# Patient Record
Sex: Female | Born: 1965
Health system: Southern US, Community
[De-identification: ages and names within clinical notes are randomized; demographics above are authoritative.]

## PROBLEM LIST (undated history)

## (undated) DIAGNOSIS — M8580 Other specified disorders of bone density and structure, unspecified site: Secondary | ICD-10-CM

## (undated) DIAGNOSIS — Z808 Family history of malignant neoplasm of other organs or systems: Secondary | ICD-10-CM

## (undated) DIAGNOSIS — N809 Endometriosis, unspecified: Secondary | ICD-10-CM

## (undated) DIAGNOSIS — K8020 Calculus of gallbladder without cholecystitis without obstruction: Secondary | ICD-10-CM

## (undated) DIAGNOSIS — K589 Irritable bowel syndrome without diarrhea: Secondary | ICD-10-CM

## (undated) DIAGNOSIS — F329 Major depressive disorder, single episode, unspecified: Secondary | ICD-10-CM

## (undated) DIAGNOSIS — F32A Depression, unspecified: Secondary | ICD-10-CM

## (undated) DIAGNOSIS — E785 Hyperlipidemia, unspecified: Secondary | ICD-10-CM

## (undated) DIAGNOSIS — Z9189 Other specified personal risk factors, not elsewhere classified: Secondary | ICD-10-CM

## (undated) DIAGNOSIS — C50919 Malignant neoplasm of unspecified site of unspecified female breast: Secondary | ICD-10-CM

## (undated) DIAGNOSIS — D1803 Hemangioma of intra-abdominal structures: Secondary | ICD-10-CM

## (undated) DIAGNOSIS — Z8489 Family history of other specified conditions: Secondary | ICD-10-CM

## (undated) DIAGNOSIS — Z800 Family history of malignant neoplasm of digestive organs: Secondary | ICD-10-CM

## (undated) DIAGNOSIS — Z803 Family history of malignant neoplasm of breast: Secondary | ICD-10-CM

## (undated) DIAGNOSIS — R87619 Unspecified abnormal cytological findings in specimens from cervix uteri: Secondary | ICD-10-CM

## (undated) HISTORY — DX: Other specified personal risk factors, not elsewhere classified: Z91.89

## (undated) HISTORY — PX: APPENDECTOMY: SHX54

## (undated) HISTORY — PX: LAPAROSCOPY: SHX197

## (undated) HISTORY — PX: TUBAL LIGATION: SHX77

## (undated) HISTORY — DX: Major depressive disorder, single episode, unspecified: F32.9

## (undated) HISTORY — DX: Unspecified abnormal cytological findings in specimens from cervix uteri: R87.619

## (undated) HISTORY — DX: Hyperlipidemia, unspecified: E78.5

## (undated) HISTORY — DX: Irritable bowel syndrome, unspecified: K58.9

## (undated) HISTORY — DX: Family history of malignant neoplasm of digestive organs: Z80.0

## (undated) HISTORY — DX: Endometriosis, unspecified: N80.9

## (undated) HISTORY — PX: ECTOPIC PREGNANCY SURGERY: SHX613

## (undated) HISTORY — DX: Family history of malignant neoplasm of breast: Z80.3

## (undated) HISTORY — DX: Other specified disorders of bone density and structure, unspecified site: M85.80

## (undated) HISTORY — PX: SHOULDER SURGERY: SHX246

## (undated) HISTORY — DX: Family history of malignant neoplasm of other organs or systems: Z80.8

## (undated) HISTORY — DX: Hemangioma of intra-abdominal structures: D18.03

## (undated) HISTORY — PX: BACK SURGERY: SHX140

## (undated) HISTORY — DX: Depression, unspecified: F32.A

---
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
## 1988-03-01 HISTORY — PX: ECTOPIC PREGNANCY SURGERY: SHX613

## 1989-03-01 HISTORY — PX: CERVICAL CONE BIOPSY: SUR198

## 1997-12-18 ENCOUNTER — Ambulatory Visit (HOSPITAL_COMMUNITY): Admission: RE | Admit: 1997-12-18 | Discharge: 1997-12-18 | Payer: Self-pay | Admitting: Obstetrics and Gynecology

## 1998-11-28 ENCOUNTER — Other Ambulatory Visit: Admission: RE | Admit: 1998-11-28 | Discharge: 1998-11-28 | Payer: Self-pay | Admitting: Obstetrics and Gynecology

## 1999-02-22 ENCOUNTER — Emergency Department (HOSPITAL_COMMUNITY): Admission: EM | Admit: 1999-02-22 | Discharge: 1999-02-22 | Payer: Self-pay | Admitting: Emergency Medicine

## 1999-12-23 ENCOUNTER — Other Ambulatory Visit: Admission: RE | Admit: 1999-12-23 | Discharge: 1999-12-23 | Payer: Self-pay | Admitting: Obstetrics and Gynecology

## 2001-01-24 ENCOUNTER — Other Ambulatory Visit: Admission: RE | Admit: 2001-01-24 | Discharge: 2001-01-24 | Payer: Self-pay | Admitting: Obstetrics and Gynecology

## 2002-02-05 ENCOUNTER — Other Ambulatory Visit: Admission: RE | Admit: 2002-02-05 | Discharge: 2002-02-05 | Payer: Self-pay | Admitting: Obstetrics and Gynecology

## 2003-02-07 ENCOUNTER — Other Ambulatory Visit: Admission: RE | Admit: 2003-02-07 | Discharge: 2003-02-07 | Payer: Self-pay | Admitting: Obstetrics and Gynecology

## 2003-11-29 ENCOUNTER — Encounter: Admission: RE | Admit: 2003-11-29 | Discharge: 2003-11-29 | Payer: Self-pay | Admitting: Gastroenterology

## 2004-01-20 ENCOUNTER — Encounter: Admission: RE | Admit: 2004-01-20 | Discharge: 2004-01-20 | Payer: Self-pay | Admitting: Gastroenterology

## 2004-02-04 ENCOUNTER — Ambulatory Visit (HOSPITAL_COMMUNITY): Admission: RE | Admit: 2004-02-04 | Discharge: 2004-02-04 | Payer: Self-pay | Admitting: Gastroenterology

## 2004-03-09 ENCOUNTER — Ambulatory Visit (HOSPITAL_COMMUNITY): Admission: RE | Admit: 2004-03-09 | Discharge: 2004-03-09 | Payer: Self-pay | Admitting: Gastroenterology

## 2004-03-16 ENCOUNTER — Other Ambulatory Visit: Admission: RE | Admit: 2004-03-16 | Discharge: 2004-03-16 | Payer: Self-pay | Admitting: Obstetrics and Gynecology

## 2004-04-17 ENCOUNTER — Ambulatory Visit (HOSPITAL_COMMUNITY): Admission: RE | Admit: 2004-04-17 | Discharge: 2004-04-17 | Payer: Self-pay | Admitting: Obstetrics and Gynecology

## 2004-04-17 ENCOUNTER — Encounter (INDEPENDENT_AMBULATORY_CARE_PROVIDER_SITE_OTHER): Payer: Self-pay | Admitting: *Deleted

## 2004-07-01 ENCOUNTER — Encounter: Admission: RE | Admit: 2004-07-01 | Discharge: 2004-07-01 | Payer: Self-pay | Admitting: Gastroenterology

## 2005-04-16 ENCOUNTER — Other Ambulatory Visit: Admission: RE | Admit: 2005-04-16 | Discharge: 2005-04-16 | Payer: Self-pay | Admitting: Obstetrics and Gynecology

## 2005-06-30 ENCOUNTER — Encounter: Payer: Self-pay | Admitting: Obstetrics and Gynecology

## 2006-03-10 ENCOUNTER — Encounter: Admission: RE | Admit: 2006-03-10 | Discharge: 2006-03-10 | Payer: Self-pay | Admitting: Gastroenterology

## 2006-04-07 ENCOUNTER — Emergency Department (HOSPITAL_COMMUNITY): Admission: EM | Admit: 2006-04-07 | Discharge: 2006-04-07 | Payer: Self-pay | Admitting: Emergency Medicine

## 2006-06-24 ENCOUNTER — Ambulatory Visit (HOSPITAL_COMMUNITY): Admission: RE | Admit: 2006-06-24 | Discharge: 2006-06-24 | Payer: Self-pay | Admitting: Obstetrics and Gynecology

## 2006-06-24 ENCOUNTER — Encounter (INDEPENDENT_AMBULATORY_CARE_PROVIDER_SITE_OTHER): Payer: Self-pay | Admitting: Specialist

## 2006-07-31 HISTORY — PX: TUBAL LIGATION: SHX77

## 2009-01-06 ENCOUNTER — Encounter: Admission: RE | Admit: 2009-01-06 | Discharge: 2009-01-06 | Payer: Self-pay | Admitting: Neurosurgery

## 2009-01-17 ENCOUNTER — Ambulatory Visit (HOSPITAL_COMMUNITY): Admission: RE | Admit: 2009-01-17 | Discharge: 2009-01-17 | Payer: Self-pay | Admitting: Obstetrics and Gynecology

## 2010-06-03 LAB — CBC
HCT: 39.5 % (ref 36.0–46.0)
Hemoglobin: 13.4 g/dL (ref 12.0–15.0)
MCHC: 33.8 g/dL (ref 30.0–36.0)
MCV: 94.7 fL (ref 78.0–100.0)
Platelets: 263 10*3/uL (ref 150–400)
RBC: 4.17 MIL/uL (ref 3.87–5.11)
RDW: 12.4 % (ref 11.5–15.5)
WBC: 7.1 10*3/uL (ref 4.0–10.5)
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
## 2010-07-17 NOTE — Op Note (Signed)
NAMEMIRABELLA, Victoria Weiss NO.:  [id number]   MEDICAL RECORD NO.:  [id number]          PATIENT TYPE:  AMB   LOCATION:  ENDO                         FACILITY:  [facility]   PHYSICIAN:  James L. Malon Kindle., M.D.DATE OF BIRTH:  [birth year]   DATE OF PROCEDURE:  03/09/2004  DATE OF DISCHARGE:                                 OPERATIVE REPORT   PROCEDURE PERFORMED:  Colonoscopy.   ENDOSCOPIST:  Llana Aliment. Edwards, M.D.   MEDICATIONS:  Fentanyl 100 mcg, Versed 10 mg IV.   INSTRUMENT USED:  Pediatric Olympus adjustable colonoscope.   INDICATIONS FOR PROCEDURE:  Persistent left upper quadrant pain with a  strong family history of cancer on both sides of her family.  Grandparents  on both sides have had colon cancer.  Her mother has had bladder and colon  cancer and father has had colon polyps.  In view of her pain and strong  family history of colon as well as other malignancies, colonoscopy was  performed.   DESCRIPTION OF PROCEDURE:  The procedure had been explained to the patient  and consent obtained.  With the patient in the left lateral decubitus  position, the pediatric adjustable Olympus colonoscope was inserted and  advanced.  The prep was excellent.  The patient had a long tortuous colon  and several maneuvers were required to advance.  We finally placed her on  her back and used abdominal pressure.  We were able to advance to the cecum.  The ileocecal valve and appendiceal orifice were seen.  The scope was  withdrawn and the cecum, ascending colon, transverse colon, splenic flexure,  descending and sigmoid colon were seen well.  No polyps were seen  throughout.  There was no diverticular disease.  Other than the length of  the colon, there were no gross abnormalities and the mucosa appeared normal.  The scope was withdrawn down to the rectum.  The rectum was without polyps  and was otherwise normal.   ASSESSMENT:  1.  Left upper quadrant pain probably due to  irritable bowel syndrome, 7892.  2.  Strong family history of colonic neoplasia, V160.   PLAN:  Will recommend yearly Hemoccults, repeat colonoscopy in five years  and will keep her on her current medications and will see her back in the  office in six months.       JLE/MEDQ  D:  03/09/2004  T:  03/09/2004  Job:  [id number]   cc:   Oley Balm. Georgina Pillion, M.D.  [address]  [place]  [place] [address]  Fax: [phone]

## 2010-07-17 NOTE — H&P (Signed)
NAMEMarland Weiss  Victoria, Weiss NO.:  [id number]   MEDICAL RECORD NO.:  [id number]           PATIENT TYPE:   LOCATION:                                 FACILITY:   PHYSICIAN:  Guy Sandifer. Arleta Creek, M.D.   DATE OF BIRTH:   DATE OF ADMISSION:  04/17/2004  DATE OF DISCHARGE:                                HISTORY & PHYSICAL   CHIEF COMPLAINT:  Heavy menses.   HISTORY OF PRESENT ILLNESS:  This patient is a 45 year old married white  female, G3, P1, with a long history of endometriosis who noted increasingly  heavy menses while on the birth control pill.  Also, she had undergone an  extensive work-up for left upper quadrant pain including an abdominopelvic  CT scan.  There was an incidental finding of a 2.6 cm cyst on her ovary on  January 20, 2004.  Ultrasound in my office on March 23, 2004 revealed a  3.1 cm simple cyst of the left ovary.  A sonohystogram was consistent with a  1.1 cm mass in the endometrial cavity.  Follow up ultrasound on April 14, 2004 revealed an unchanged 3.1 cm simple cyst of the left ovary again  present.  After discussion of the options, the patient is being admitted for  hysteroscopy with resectoscope, dilation and curettage, laparoscopy,  possible left ovarian cystectomy, possible left salpingo-oophorectomy.  Potential risks and complications have been discussed with the patient  preoperatively.   PAST MEDICAL HISTORY:  1.  Endometriosis.  2.  History of hemorrhoids.  3.  History of depression.   PAST SURGICAL HISTORY:  1.  Appendectomy at age 60.  2.  D&C in 16.  3.  Right ectopic pregnancy with right salpingectomy and laparotomy in 1991.  4.  Laparoscopy in 1990, 1992, and 1998.  5.  Vein ligation in 1995 in the right leg.   OBSTETRIC HISTORY:  1.  Miscarriage x1.  2.  Ectopic pregnancy x1.  3.  Vaginal delivery x1.   FAMILY HISTORY:  Colon cancer in maternal grandfather.   SOCIAL HISTORY:  Denies tobacco, alcohol, or drug  abuse.   MEDICATIONS:  1.  Lo/Ovral daily.  2.  Lamictal daily.  3.  Lexapro daily.   ALLERGIES:  No known drug allergies.   REVIEW OF SYSTEMS:  NEUROLOGIC:  Denies headache.  CARDIAC:  Denies chest  pain.  PULMONARY:  Denies shortness of breath.  GASTROINTESTINAL:  Denies  recent changes in bowel habits.   PHYSICAL EXAMINATION:  VITAL SIGNS:  Height 5 feet, 7 inches.  Weight 134-  1/2 pounds.  Blood pressure 92/60.  HEENT:  Without thyromegaly.  LUNGS:  Clear to auscultation.  HEART:  Regular rate and rhythm.  BACK:  Without CVA tenderness.  BREASTS:  Without mass, retraction, or discharge.  ABDOMEN:  Soft, nontender, without masses.  PELVIC:  Vulva, vaginal, and cervix without lesion.  Uterus is normal size,  mobile, nontender.  Adnexa nontender without masses.  EXTREMITIES:  Neurological exam grossly within normal limits.   ASSESSMENT:  1.  Menorrhagia.  2.  Left ovarian cyst.  PLAN:  Hysteroscopy and resectoscope, dilation and curettage, laparoscopy,  possible left ovaria cystectomy, possible left salpingo-oophorectomy.      JET/MEDQ  D:  04/14/2004  T:  04/14/2004  Job:  [id number]

## 2010-07-17 NOTE — Op Note (Signed)
NAMEDAMYIA, STRIDER NO.:  [id number]   MEDICAL RECORD NO.:  [id number]          PATIENT TYPE:  AMB   LOCATION:  SDC                           [facility]ILITY[facility]   PHYSICIAN:  Guy Sandifer. Tomblin II, M.D.DATE OF BIRTH:  [birth year]   DATE OF PROCEDURE:  04/17/2004  DATE OF DISCHARGE:                                 OPERATIVE REPORT   PREOPERATIVE DIAGNOSES:  1.  Menorrhagia.  2.  Left ovarian cyst.   POSTOPERATIVE DIAGNOSES:  1.  Endometrial polyp.  2.  Left ovarian cyst.  3.  Endometriosis.   PROCEDURE:  Laparoscopy with left ovarian cystectomy and ablation of  endometriosis, hysteroscopy with resection of endometrial polyp and  dilatation curettage and 1% Xylocaine paracervical block.   SURGEON:  Guy Sandifer. Henderson Cloud, M.D.   ANESTHESIA:  General with endotracheal intubation.   ESTIMATED BLOOD LOSS:  Less than 50 mL.   I & O'S SORBITOL DISTENDING MEDIA:  40 mL deficit with some of that on the  floor.   SPECIMENS:  1.  Endometrial polyp.  2.  Endometrial curettings.  3.  Left ovarian cyst wall.   INDICATIONS AND CONSENT:  This patient is a 45 year old, married white  female, G3, P1 with a long history of endometriosis with increasingly heavy  vaginal bleeding and a left ovarian cyst. The details are dictated in the  history and physical. Laparoscopy, left ovarian cystectomy, possible left  salpingo-oophorectomy, hysteroscopy D&C has been discussed preoperatively.  The potential risks and complications had been reviewed preoperatively  including but not limited to infection, uterine perforation, bowel, bladder,  ureteral damage, bleeding requiring transfusion of blood products with  possible transfusion reaction, HIV and hepatitis acquisition, DVT, PE,  pneumonia, recurrent pain or abnormal bleeding, hysterectomy and  dyspareunia. All questions have been answered and consent is signed on the  chart.   FINDINGS:  There is approximately a 1.5 cm pedunculated  polypoid structure  on the anterior endometrial canal. The fallopian tube ostia are identified  bilaterally. Abdominally there are powder burn type implants of  endometriosis on the vesicouterine peritoneum on the right side of the  midline in the right side of the posterior cul-de-sac medial to the  uterosacral ligaments and on the left pelvic sidewall well superior to the  course of the ureter. The right ovary appears normal. The right fallopian  tube is surgically absent. The left fallopian tube is normal in appearance  with luxuriant fimbria. The left ovary contains approximately a 3 cm cyst  but is otherwise normal in appearance.  The uterus is about six weeks in  size but smooth in contour.   DESCRIPTION OF PROCEDURE:  In the preop holding area, the patient's left  lower quadrant has been marked with a pen. The patient was then taken to the  operating room where she was identified, placed in the dorsal supine  position and general anesthesia is then induced via endotracheal intubation.  She was then placed in the dorsal lithotomy position where she was prepped  abdominally and vaginally, bladder straight catheterized and she is draped  in a sterile fashion. A bivalve speculum is placed in the vagina, the  anterior cervical lip is injected with 1% Xylocaine and grasped with a  single tooth tenaculum.  Paracervical block was then placed at the 2, 4, 5,  7, 8 and 10 o'clock positions with approximately 20 mL of 1% Xylocaine. The  cervix was then gently progressively dilated to a 31 Pratt dilator. The  resectoscope was then placed in the endocervical canal and advanced under  direct visualization using sorbitol distending media. The above findings are  noted. Then using the single right angle wire loop, the polyp is resected  without difficulty. The specimen is removed. Sharp curettage is then carried  out. The single tooth tenaculum is replaced with a hulka tenaculum and  attention is  turned to the abdomen. A small infraumbilical incision is made  and a disposable Veress is placed on the first attempt without difficulty.  Placement is verified with a normal syringe and drop test. Two liters of gas  are then insufflated under low pressure with good tympany in the right upper  quadrant.  The Veress needle is then removed and is replaced with a 10/11  disposable trocar sleeve. Placement is verified with the laparoscope and no  damage to surrounding structures is noted. A small suprapubic and later left  lower quadrant incisions are made after careful transillumination and 5 mm  disposable trocar sleeves are placed under direct visualization without  difficulty. The above findings are noted. The areas of endometriosis are  ablated with bipolar cautery.  The left ovary is then grasped and an  incision is made in the capsule over the left ovary using the Gyrus bipolar  cautery needle.  This enters the cyst which produces straw colored serous  fluid.  The ovarian capsule is then opened and the cyst wall was stripped  out with scissors and sent to pathology. Bipolar cautery is used to obtain  complete hemostasis in the ovary.  Copious irrigation is carried out and  excess fluid is removed. After assuring good hemostasis, Intercede is  backloaded through the laparoscope and placed around the left ovary.  The  suprapubic and left lower quadrant trocar sleeves are removed,  pneumoperitoneum is reduced, the umbilical trocar sleeve is removed. A  single 2-0 Vicryl suture is then placed in the subcutaneous tissues at the  umbilical incision with care being taken not to pick up any underlying  structures. Marcaine 0.5% is injected in all the incisions and the skin is  closed in all the incisions with Dermabond. Hulka tenaculum is removed and  good hemostasis is noted. All counts are correct. The patient is taken to the recovery room in stable condition.      JET/MEDQ  D:   04/17/2004  T:  04/18/2004  Job:  [id number]

## 2010-07-17 NOTE — Op Note (Signed)
NAME:  Victoria Weiss, Victoria Weiss NO.:  [id number]   MEDICAL RECORD NO.:  [id number]          PATIENT TYPE:  AMB   LOCATION:  SDC                           FACILITY[facility]   PHYSICIAN:  Guy Sandifer. Henderson Cloud, M.D. DATE OF BIRTH:  1965-04-15   DATE OF PROCEDURE:  06/23/2006  DATE OF DISCHARGE:                               OPERATIVE REPORT   PREOPERATIVE DIAGNOSES:  1. Menorrhagia  2. Dysmenorrhea.  3. Desires permanent sterilization.   POSTOPERATIVE DIAGNOSES:  1. Menorrhagia.  2. Endometriosis.  3. Desires permanent sterilization.   PROCEDURES:  1. NovaSure endometrial ablation.  2. Hysteroscopy.  3. Dilatation and curettage.  4. Laparoscopy with left tubal ligation with Filshie clips.  5. Ablation of endometriosis.   SURGEON:  Guy Sandifer. Henderson Cloud, M.D.   ANESTHESIA:  General with endotracheal intubation.   SPECIMENS:  Endometrial curettings.   ESTIMATED BLOOD LOSS:  Drops.   I&O OF SORBITOL DISTENDING MEDIA:  60 mL deficit.   INDICATIONS AND CONSENT:  This patient is a 45 year old married white  female, G3, P1, with known endometriosis, who Is also status post right  salpingectomy for ectopic pregnancy.  Has heavy painful menses and  desires permanent sterilization.  Alternate methods of contraception  have been reviewed.  Laparoscopy, tubal ligation with Filshie clips,  Hysteroscopy, dilatation and curettage, and NovaSure endometrial  ablation have been discussed preoperatively.  Potential risks and  complications have been discussed preoperatively, including but not  limited to infection, bowel, bladder or ureteral damage; bleeding  requiring transfusion of blood products with possible transfusion  reaction, HIV and hepatitis acquisition; uterine perforation, DVT, PE,  pneumonia, hysterectomy.  Permanence of tubal ligation, failure rate and  increased ectopic risk have been reviewed.  NovaSure endometrial  ablation success and failure rates have been reviewed.   All questions  have been answered and consent is signed on the chart.   FINDINGS:  Endometrial cavity is without abnormal structure.  Upper  abdomen is grossly normal.  Uterus is upper normal size.  Anterior cul-  de-sac is normal.  There is a black powder burn-type endometrial implant  in the center of the posterior cul-de-sac.  Along the left uterosacral  ligament there are white puckered implants of endometriosis.  Ovaries  are normal bilaterally.  The left fallopian tube is normal.  The right  fallopian tube has been removed.   PROCEDURE:  The patient is taken to operating room where she is  identified, placed in dorsal supine position, and general anesthesia is  induced via endotracheal intubation.  She is then placed in the dorsal  lithotomy position, where she is prepped abdominally and vaginally, the  bladder is straight-catheterized, and she is draped in a sterile  fashion.  A bivalve speculum is placed in the vagina and the anterior  cervical lip is injected with 1% Xylocaine and the cervix is grasped  with single-tooth tenaculum.  A paracervical block is placed in the 2,  4, 5, 7, 8 and 10 10 o'clock positions with approximately 20 mL total of  1% plain Xylocaine.  The uterus sounds  to 7 cm.  The cervix measures to  3 cm.  The cervix is gently progressively dilated.  The diagnostic  hysteroscope is placed in the endocervical canal and advanced under  direct visualization using sorbitol distending media.  The above  findings are noted.  The hysteroscope is withdrawn.  Sharp curettage is  carried out.  The NovaSure device is then placed in the endometrial  cavity.  After adjusting for the proper width a cavity test was carried  out, which is successful.  Ablation is then carried out and the machine  completes its cycle at 1 minute 18 seconds.  The NovaSure device is then  removed and inspection reveals it to be intact.  The hysteroscope is  again placed in the endocervical  canal and advanced under direct  visualization.  Good cautery is noted throughout the cavity and in the  cornua.  No evidence of perforation is noted.  The hysteroscope is  removed and a Hulka tenaculum is placed in the single-tooth tenaculum is  removed.  Attention was turned to the abdomen.  The infraumbilical and  suprapubic areas are injected the midline with 0.5% plain Marcaine.  A  small infraumbilical incision is made.  A disposable Veress needle is  placed and a normal syringe and drop test are noted.  Two liters of gas  are insufflated under low pressure with good tympany in the right upper  quadrant.  The Veress needle is removed.  A 10/11 XL bladeless  disposable trocar is then placed under direct visualization using the  diagnostic laparoscopic.  After placement this is switched to the  operative laparoscopic.  A small suprapubic incision is made in the  midline and a 5-mm XL bladeless disposable trocar sleeve is placed under  direct visualization without difficulty.  The above findings are noted.  A small amount of sorbitol in the posterior cul-de-sac is aspirated.  The lesions of endometriosis are then ablated with bipolar cautery.  This is well clear of the course of the ureter.  The after carefully  identifying the left fallopian tube from cornu to fimbriae, a Filshie  clip is placed on the proximal one-third of the tube.  The applicator is  then removed and inspection reveals the entire width of the tube to be  within the clip and the heel of the clip is easily noted through the  mesosalpinx.  Marcaine 0.5% plain 9 mL is then dripped on the left  fallopian tube.  The suprapubic trocar sleeve is removed and  pneumoperitoneum is reduced and the umbilical trocar sleeve is removed.  The skin of the umbilical incision is closed with a subcuticular 3-0  Vicryl suture.  Dermabond is placed on both incisions.  Hulka tenaculum is removed and no bleeding was noted.  All counts are  correct.  The  patient is awakened, taken to the recovery room in stable condition.      Guy Sandifer Henderson Cloud, M.D.  Electronically Signed     JET/MEDQ  D:  06/24/2006  T:  06/24/2006  Job:  [id number]

## 2010-07-17 NOTE — H&P (Signed)
Victoria Weiss, Victoria Weiss NO.:  [id number]   MEDICAL RECORD NO.:  [id number]          PATIENT TYPE:  AMB   LOCATION:  SDC                           FACILITY:  [facility]   PHYSICIAN:  Guy Sandifer. Henderson Cloud, M.D. DATE OF BIRTH:  05-07-1965   DATE OF ADMISSION:  DATE OF DISCHARGE:                              HISTORY & PHYSICAL   PREADMISSION HISTORY AND PHYSICAL   CHIEF COMPLAINT:  Heavy, painful menses; desires permanent  sterilization.   HISTORY OF PRESENT ILLNESS:  This patient is a 45 year old, married  white female, G3, P1, with known endometriosis status post right  salpingectomy for ectopic pregnancy status post hysteroscopy with  resection of submucous fibroid in 2006, continues to have heavy, painful  menses despite trials with a birth control pill.  Ultrasound on December 20, 2005, reveals a uterus measuring 8.8 x 4.5 x 5.2 cm.  Sonohystogram  is negative for intracavity masses, and the ovaries appeared normal.  After discussion of the options, she has being admitted for hysteroscopy  D&C, NovaSure endometrial ablation, and laparoscopy with bilateral tubal  ligation.  Potential risks and complications have been discussed  preoperatively.   PAST MEDICAL HISTORY:  1. Endometriosis.  2. History of hemorrhoids.  3. Depression.   PAST SURGICAL HISTORY:  1. Appendectomy at age 56.  2. D&C, 48.  3. Right ectopic pregnancy with right salpingectomy and laparotomy in      1991.  4. Laparoscopy in 1990, 1992, and 1998.  5. Vein ligation in right leg, 1995.  6. Hysteroscopy D&C in 2006.   OBSTETRIC HISTORY:  Miscarriage x1, ectopic pregnancy x1, vaginal  delivery x1.   FAMILY HISTORY:  Colon cancer, maternal grandfather.   SOCIAL HISTORY:  Denies tobacco, alcohol, or drug abuse.   MEDICATIONS:  Lamictal, Wellbutrin, and Lexapro.  No drug allergies.   REVIEW OF SYSTEMS:  NEURO:  Denies headache.  CARDIAC:  Denies chest  pain.  PULMONARY:  Denies shortness of  breath.  GI:  Denies recent  changes in bowel habits.   PHYSICAL EXAMINATION:  VITAL SIGNS:  Height 5 feet 7 inches, weight  144.5 pounds, blood pressure 102/60.  HEENT:  Without thyromegaly.  LUNGS:  Clear to auscultation.  HEART:  Regular rate and rhythm.  BACK:  Without CVA tenderness.  BREASTS:  Without mass, retraction, or discharge.  ABDOMEN:  Soft and nontender without masses.  PELVIC EXAM:  Vulva, vagina, and cervix without lesion, uterus normal  size, mobile, and nontender, adnexa nontender without masses.  EXTREMITIES:  Grossly within normal limits.  NEUROLOGICAL EXAM:  Grossly within normal limits.   ASSESSMENT:  1. Menorrhagia.  2. Dysmenorrhea.  3. Desires permanent sterilization.   PLAN:  Laparoscopy with bilateral tubal ligation; hysteroscopy  dilatation and curettage; NovaSure endometrial ablation.      Guy Sandifer Henderson Cloud, M.D.  Electronically Signed     JET/MEDQ  D:  04/04/2006  T:  04/04/2006  Job:  [id number]

## 2011-01-07 ENCOUNTER — Other Ambulatory Visit: Payer: Self-pay | Admitting: Gastroenterology

## 2011-01-07 DIAGNOSIS — D1800 Hemangioma unspecified site: Secondary | ICD-10-CM

## 2011-01-11 ENCOUNTER — Other Ambulatory Visit: Payer: Self-pay

## 2011-03-31 ENCOUNTER — Ambulatory Visit
Admission: RE | Admit: 2011-03-31 | Discharge: 2011-03-31 | Disposition: A | Discharge status: Home / Self Care | Payer: BC Managed Care – PPO | Source: Ambulatory Visit | Attending: Gastroenterology | Admitting: Gastroenterology

## 2011-03-31 DIAGNOSIS — D1800 Hemangioma unspecified site: Secondary | ICD-10-CM

## 2011-09-01 ENCOUNTER — Other Ambulatory Visit: Payer: Self-pay | Admitting: Neurosurgery

## 2011-09-01 ENCOUNTER — Other Ambulatory Visit: Payer: Self-pay | Admitting: Gastroenterology

## 2011-09-01 DIAGNOSIS — M47817 Spondylosis without myelopathy or radiculopathy, lumbosacral region: Secondary | ICD-10-CM

## 2011-09-01 DIAGNOSIS — D1803 Hemangioma of intra-abdominal structures: Secondary | ICD-10-CM

## 2011-09-06 ENCOUNTER — Ambulatory Visit
Admission: RE | Admit: 2011-09-06 | Discharge: 2011-09-06 | Disposition: A | Discharge status: Home / Self Care | Payer: BC Managed Care – PPO | Source: Ambulatory Visit | Attending: Neurosurgery | Admitting: Neurosurgery

## 2011-09-06 DIAGNOSIS — M47817 Spondylosis without myelopathy or radiculopathy, lumbosacral region: Secondary | ICD-10-CM

## 2011-09-13 ENCOUNTER — Other Ambulatory Visit: Payer: Self-pay | Admitting: Neurosurgery

## 2011-09-13 DIAGNOSIS — M47816 Spondylosis without myelopathy or radiculopathy, lumbar region: Secondary | ICD-10-CM

## 2011-09-22 ENCOUNTER — Ambulatory Visit
Admission: RE | Admit: 2011-09-22 | Discharge: 2011-09-22 | Disposition: A | Discharge status: Home / Self Care | Payer: BC Managed Care – PPO | Source: Ambulatory Visit | Attending: Gastroenterology | Admitting: Gastroenterology

## 2011-09-22 DIAGNOSIS — D1803 Hemangioma of intra-abdominal structures: Secondary | ICD-10-CM

## 2012-05-19 ENCOUNTER — Other Ambulatory Visit: Payer: Self-pay | Admitting: Neurosurgery

## 2012-05-19 DIAGNOSIS — M47816 Spondylosis without myelopathy or radiculopathy, lumbar region: Secondary | ICD-10-CM

## 2012-05-26 ENCOUNTER — Ambulatory Visit
Admission: RE | Admit: 2012-05-26 | Discharge: 2012-05-26 | Disposition: A | Discharge status: Home / Self Care | Payer: BC Managed Care – PPO | Source: Ambulatory Visit | Attending: Neurosurgery | Admitting: Neurosurgery

## 2012-05-26 VITALS — BP 115/68 | HR 82

## 2012-05-26 DIAGNOSIS — M47816 Spondylosis without myelopathy or radiculopathy, lumbar region: Secondary | ICD-10-CM

## 2012-05-26 MED ORDER — IOHEXOL 180 MG/ML  SOLN
1.0000 mL | Freq: Once | INTRAMUSCULAR | Status: AC | PRN
  Administered 2012-05-26: 1 mL via EPIDURAL

## 2012-05-26 MED ORDER — METHYLPREDNISOLONE ACETATE 40 MG/ML INJ SUSP (RADIOLOG
120.0000 mg | Freq: Once | INTRAMUSCULAR | Status: AC
  Administered 2012-05-26: 120 mg via EPIDURAL

## 2012-06-05 ENCOUNTER — Other Ambulatory Visit: Payer: Self-pay | Admitting: Neurosurgery

## 2012-06-05 DIAGNOSIS — M47817 Spondylosis without myelopathy or radiculopathy, lumbosacral region: Secondary | ICD-10-CM

## 2013-04-09 ENCOUNTER — Other Ambulatory Visit: Payer: Self-pay | Admitting: Neurosurgery

## 2013-04-09 DIAGNOSIS — M47816 Spondylosis without myelopathy or radiculopathy, lumbar region: Secondary | ICD-10-CM

## 2013-04-12 ENCOUNTER — Ambulatory Visit
Admission: RE | Admit: 2013-04-12 | Discharge: 2013-04-12 | Disposition: A | Discharge status: Home / Self Care | Payer: BC Managed Care – PPO | Source: Ambulatory Visit | Attending: Neurosurgery | Admitting: Neurosurgery

## 2013-04-12 DIAGNOSIS — M47816 Spondylosis without myelopathy or radiculopathy, lumbar region: Secondary | ICD-10-CM

## 2013-04-12 MED ORDER — IOHEXOL 180 MG/ML  SOLN: 1.0000 mL | Freq: Once | INTRAMUSCULAR | Status: AC | PRN

## 2013-04-12 MED ORDER — METHYLPREDNISOLONE ACETATE 40 MG/ML INJ SUSP (RADIOLOG: 120.0000 mg | Freq: Once | INTRAMUSCULAR | Status: DC

## 2013-04-12 NOTE — Discharge Instructions (Signed)

## 2013-08-21 ENCOUNTER — Other Ambulatory Visit: Payer: Self-pay | Admitting: Neurosurgery

## 2013-08-21 DIAGNOSIS — M47817 Spondylosis without myelopathy or radiculopathy, lumbosacral region: Secondary | ICD-10-CM

## 2013-08-29 ENCOUNTER — Ambulatory Visit
Admission: RE | Admit: 2013-08-29 | Discharge: 2013-08-29 | Disposition: A | Discharge status: Home / Self Care | Payer: BC Managed Care – PPO | Source: Ambulatory Visit | Attending: Neurosurgery | Admitting: Neurosurgery

## 2013-08-29 DIAGNOSIS — M47817 Spondylosis without myelopathy or radiculopathy, lumbosacral region: Secondary | ICD-10-CM

## 2013-10-23 ENCOUNTER — Encounter: Payer: Self-pay | Admitting: Neurology

## 2013-10-23 ENCOUNTER — Ambulatory Visit (INDEPENDENT_AMBULATORY_CARE_PROVIDER_SITE_OTHER): Payer: BC Managed Care – PPO | Admitting: Neurology

## 2013-10-23 VITALS — BP 104/78 | HR 74 | Ht 66.14 in | Wt 165.2 lb

## 2013-10-23 DIAGNOSIS — R413 Other amnesia: Secondary | ICD-10-CM

## 2013-10-23 DIAGNOSIS — R209 Unspecified disturbances of skin sensation: Secondary | ICD-10-CM

## 2013-10-23 DIAGNOSIS — R292 Abnormal reflex: Secondary | ICD-10-CM

## 2013-10-23 NOTE — Patient Instructions (Signed)
1.  Check blood work 2.  MRI brain wwo contrast  3.  NCS/EMG of the legs 4.  Neuropsychiatric testing 5.  Return to clinic in 3 months

## 2013-10-23 NOTE — Addendum Note (Signed)
Addended by: Chester Holstein on: 10/23/2013 04:29 PM   Modules accepted: Orders

## 2013-10-23 NOTE — Progress Notes (Signed)
Note faxed.

## 2013-10-23 NOTE — Progress Notes (Signed)
[facility] Clinic Note - Initial Visit   Date: 10/23/2013  Victoria Weiss: [id number] DOB: 1965-10-12   Dear Dr. Stephanie Acre:  Thank you for your kind referral of Victoria Weiss for consultation of paresthesias. Although her history is well known to you, please allow [place] to reiterate it for the purpose of our medical record. The patient was accompanied to the clinic by self.    History of Present Illness: Victoria Weiss is a 48 y.o. right-handed Caucasian female with history of depression presenting for evaluation of short-term memory and paresthesias of the feet.    Starting around May 2015, she developed tingling of the bilateral feet, especially involving the soles of the feet and medial aspect of the feet.  Symptoms are intermittent, occuring a few times a day and lasting seconds.  She has not noticed whether rest or activity trigger symptoms.  No alleviating or exacerbating factors.  She has history of lumbar disc extrusion at L3-4 and L4-5 and seems asymptomatic from it.  She denies any back pain at this time.  She complains of stiffness of the hands, but denies numbness/tinging of the hands.  No weakness of the arms or legs.  She also complains of short-term memory since ~2012, especially with word-finding difficulty.  She denies forgetting appointments, dates, names, or faces. She is not substituting words or repeating herself. It seems to have a bigger impact socially because of embarrassment when she is unable recall the name of things.   She is still able to do her daily activities without difficulty.  Mood is good.      She saw her PCP for the above symptoms who checked routine lab studies (see below), which returned normal.     Out-side paper records, electronic medical record, and images have been reviewed where available and summarized as:  Labs 09/23/2013:  Ferritin 92, RPR NR, vitamin B12 746, folate >19.9  MRI lumbar spine wo contrast  08/29/2013: Unchanged appearance of the lumbar spine with small disc extrusions at L3-4 and L4-5 resulting in mild lateral recess stenosis, greatest on the right at L4-5.    Past Medical History  Diagnosis Date  . Hyperlipidemia   . Depression   . IBS (irritable bowel syndrome)   . Endometriosis     Past Surgical History  Procedure Laterality Date  . Shoulder surgery Bilateral   . Tubal ligation    . Ectopic pregnancy surgery    . Laparoscopy      Medications:  Wellbutrin XL $RemoveBefor'150mg'pWgMNrPSdgGG$  daily Lexapro $RemoveBefor'20mg'jArkRKDjYRnE$  daily Tramadol $RemoveBefore'50mg'AHSQiouPrjqsq$  q6h prn pain   Allergies: No Known Allergies  Family History: Family History  Problem Relation Age of Onset  . Prostate cancer Father     Living, 58  . Bladder Cancer Mother     Living, 78  . Hypertension Mother   . Aneurysm Mother     brain  . Colon cancer Maternal Grandfather   . Healthy Brother     x 2  . Healthy Daughter     Social History: History   Social History  . Marital Status: Married    Spouse Name: N/A    Number of Children: N/A  . Years of Education: N/A   Occupational History  . Not on file.   Social History Main Topics  . Smoking status: Former Research scientist (life sciences)  . Smokeless tobacco: Not on file     Comment: Quit >24 yrs ago  . Alcohol Use: No  . Drug Use: No  .  Sexual Activity: Not on file   Other Topics Concern  . Not on file   Social History Narrative   Lives with husband and daughter.   Working as Radiation protection practitioner.   Highest level of education:  Some college    Review of Systems:  CONSTITUTIONAL: No fevers, chills, night sweats, or weight loss.   EYES: No visual changes or eye pain ENT: No hearing changes.  No history of nose bleeds.   RESPIRATORY: No cough, wheezing and shortness of breath.   CARDIOVASCULAR: Negative for chest pain, and palpitations.   GI: Negative for abdominal discomfort, blood in stools or black stools.  No recent change in bowel habits.   GU:  No history of incontinence.   MUSCLOSKELETAL: No history  of joint pain or swelling.  No myalgias.   SKIN: Negative for lesions, rash, and itching.   HEMATOLOGY/ONCOLOGY: Negative for prolonged bleeding, bruising easily, and swollen nodes.  ENDOCRINE: Negative for cold or heat intolerance, polydipsia or goiter.   PSYCH:  No depression or anxiety symptoms.   NEURO: As Above.   Vital Signs:  BP 104/78  Pulse 74  Ht 5' 6.14" (1.68 m)  Wt 165 lb 4 oz (74.957 kg)  BMI 26.56 kg/m2  SpO2 97% Pain Scale: 0 on a scale of 0-10   General Medical Exam:   General:  Well appearing, comfortable.   Eyes/ENT: see cranial nerve examination.   Neck: No masses appreciated.  Full range of motion without tenderness.  No carotid bruits. Respiratory:  Clear to auscultation, good air entry bilaterally.   Cardiac:  Regular rate and rhythm, no murmur.   Extremities:  No deformities, edema, or skin discoloration. Good capillary refill.   Skin:  Skin color, texture, turgor normal. No rashes or lesions.  Neurological Exam: MENTAL STATUS including orientation to time, place, person, attention span and concentration, language, and fund of knowledge is normal.  Speech is not dysarthric.  Naming, repetitive, and comprehension intact.  Montreal Cognitive Assessment  10/23/2013  Visuospatial/ Executive (0/5) 5  Naming (0/3) 3  Attention: Read list of digits (0/2) 2  Attention: Read list of letters (0/1) 1  Attention: Serial 7 subtraction starting at 100 (0/3) 3  Language: Repeat phrase (0/2) 2  Language : Fluency (0/1) 1  Abstraction (0/2) 1  Delayed Recall (0/5) 1  Orientation (0/6) 6  Total 25  Adjusted Score (based on education) 25    CRANIAL NERVES: II:  No visual field defects.  Unremarkable fundi.   III-IV-VI: Pupils equal round and reactive to light.  Normal conjugate, extra-ocular eye movements in all directions of gaze.  No nystagmus.  No ptosis.   V:  Normal facial sensation.   VII:  Normal facial symmetry and movements. Bilateral palmomental reflexes  are present. VIII:  Normal hearing and vestibular function.   IX-X:  Normal palatal movement.   XI:  Normal shoulder shrug and head rotation.   XII:  Normal tongue strength and range of motion, no deviation or fasciculation.  MOTOR:  No atrophy, fasciculations or abnormal movements.  No pronator drift.  Tone is normal.    Right Upper Extremity:    Left Upper Extremity:    Deltoid  5/5   Deltoid  5/5   Biceps  5/5   Biceps  5/5   Triceps  5/5   Triceps  5/5   Wrist extensors  5/5   Wrist extensors  5/5   Wrist flexors  5/5   Wrist flexors  5/5  Finger extensors  5/5   Finger extensors  5/5   Finger flexors  5/5   Finger flexors  5/5   Dorsal interossei  5/5   Dorsal interossei  5/5   Abductor pollicis  5/5   Abductor pollicis  5/5   Tone (Ashworth scale)  0  Tone (Ashworth scale)  0   Right Lower Extremity:    Left Lower Extremity:    Hip flexors  5/5   Hip flexors  5/5   Hip extensors  5/5   Hip extensors  5/5   Knee flexors  5/5   Knee flexors  5/5   Knee extensors  5/5   Knee extensors  5/5   Dorsiflexors  5/5   Dorsiflexors  5/5   Plantarflexors  5/5   Plantarflexors  5/5   Toe extensors  5/5   Toe extensors  5/5   Toe flexors  5/5   Toe flexors  5/5   Tone (Ashworth scale)  0  Tone (Ashworth scale)  0   MSRs:  Right                                                                 Left brachioradialis 3+  brachioradialis 3+  biceps 3+  biceps 3+  triceps 3+  triceps 3+  patellar 3+  patellar 3+  ankle jerk 2+  ankle jerk 2+  Hoffman yes  Hoffman yes  plantar response down  plantar response down  Crossed adductors present bilaterally.  Plantar responses are flexor.  No clonus.  SENSORY:  Normal and symmetric perception of light touch, pinprick, vibration, and proprioception.  Romberg's sign absent.   COORDINATION/GAIT: Normal finger-to- nose-finger and heel-to-shin.  Intact rapid alternating movements bilaterally.  Able to rise from a chair without using arms.  Gait  narrow based and stable. Tandem and stressed gait intact.    IMPRESSION: Mrs. Robideau is a 48 year-old female presenting for evaluation of (1) short-term memory impairment and (2) paresthesias of the feet.  Her cognitive exam shows deficits in delayed recall with relative preservation of other domains. For individuals in her age rage, early onset dementias are uncommon.  Although depressed mood is very common and can manifest with memory changes, she reports mood has been really good lately. For more complete testing, I will refer her for neuropsychiatric testing.  I will also order MRI brain wwo contrast to evaluate for demyelinating disease given her age, paresthesias, hyperreflexia, and memory changes.  Finally, because symptoms are distal and symmetric, EMG of the legs will be obtained to look for neuropathy, although suspicion is low.   PLAN/RECOMMENDATIONS:  1.  MRI brain wwo contrast 2.  Check TSH, ESR, CRP, ANA, copper, vitamin D, SPEP/UPEP with IFE, 2hr-GTT 3.  NCS/EMG of right > left leg 4.  Neuropsychiatry testing 5.  Return to clinic in 48-months.   The duration of this appointment visit was 60 minutes of face-to-face time with the patient.  Greater than 50% of this time was spent in counseling, explanation of diagnosis, planning of further management, and coordination of care.   Thank you for allowing me to participate in patient's care.  If I can answer any additional questions, I would be pleased to do so.    Sincerely,  Tilla Wilborn K. Posey Pronto, DO

## 2013-10-24 LAB — ANA: Anti Nuclear Antibody(ANA): NEGATIVE

## 2013-10-24 LAB — TSH: TSH: 1.626 u[IU]/mL (ref 0.350–4.500)

## 2013-10-24 LAB — C-REACTIVE PROTEIN: CRP: 1.2 mg/dL — ABNORMAL HIGH (ref <0.60)

## 2013-10-24 LAB — VITAMIN D 25 HYDROXY (VIT D DEFICIENCY, FRACTURES): Vit D, 25-Hydroxy: 72 ng/mL (ref 30–89)

## 2013-10-24 LAB — SEDIMENTATION RATE: Sed Rate: 42 mm/hr — ABNORMAL HIGH (ref 0–22)

## 2013-10-25 LAB — SPEP & IFE WITH QIG
Albumin ELP: 56.7 % (ref 55.8–66.1)
Alpha-1-Globulin: 5.2 % — ABNORMAL HIGH (ref 2.9–4.9)
Alpha-2-Globulin: 10.1 % (ref 7.1–11.8)
Beta 2: 6.2 % (ref 3.2–6.5)
Beta Globulin: 7.2 % (ref 4.7–7.2)
Gamma Globulin: 14.6 % (ref 11.1–18.8)
IgA: 240 mg/dL (ref 69–380)
IgG (Immunoglobin G), Serum: 1010 mg/dL (ref 690–1700)
IgM, Serum: 198 mg/dL (ref 52–322)
Total Protein, Serum Electrophoresis: 6.8 g/dL (ref 6.0–8.3)

## 2013-10-25 LAB — COPPER, SERUM: Copper: 195 ug/dL — ABNORMAL HIGH (ref 70–175)

## 2013-10-25 LAB — UIFE/LIGHT CHAINS/TP QN, 24-HR UR
Albumin, U: DETECTED
Alpha 1, Urine: DETECTED — AB
Alpha 2, Urine: DETECTED — AB
Beta, Urine: DETECTED — AB
Gamma Globulin, Urine: DETECTED — AB
Total Protein, Urine: 6 mg/dL (ref 5–24)

## 2013-10-27 ENCOUNTER — Ambulatory Visit
Admission: RE | Admit: 2013-10-27 | Discharge: 2013-10-27 | Disposition: A | Discharge status: Home / Self Care | Payer: BC Managed Care – PPO | Source: Ambulatory Visit | Attending: Neurology | Admitting: Neurology

## 2013-10-27 DIAGNOSIS — R292 Abnormal reflex: Secondary | ICD-10-CM

## 2013-10-27 DIAGNOSIS — R413 Other amnesia: Secondary | ICD-10-CM

## 2013-10-27 DIAGNOSIS — R209 Unspecified disturbances of skin sensation: Secondary | ICD-10-CM

## 2013-10-30 ENCOUNTER — Other Ambulatory Visit: Payer: BC Managed Care – PPO

## 2013-10-30 DIAGNOSIS — R209 Unspecified disturbances of skin sensation: Secondary | ICD-10-CM

## 2013-10-30 DIAGNOSIS — R292 Abnormal reflex: Secondary | ICD-10-CM

## 2013-10-30 DIAGNOSIS — R413 Other amnesia: Secondary | ICD-10-CM

## 2013-10-30 LAB — GLUCOSE TOLERANCE, 2 HOURS
Glucose, 1 Hour GTT: 126 mg/dL
Glucose, 2 hour: 99 mg/dL
Glucose, Fasting: 88 mg/dL (ref 70–99)

## 2013-10-31 ENCOUNTER — Telehealth: Payer: Self-pay | Admitting: *Deleted

## 2013-10-31 NOTE — Telephone Encounter (Signed)
Patient requesting the results of her lab work and MRI  Call back #[phone]

## 2013-11-01 NOTE — Telephone Encounter (Signed)
Discussed results of imaging and labs with patient.  Labs notable for mild elevation in ESR, CRP, and copper which can be nonspecific findings suggesting inflammation.  ANA is negative and no M protein on SPEP/UPEP.  TSH also normal.  MRI brain did not show any gross abnormalities (temporal fracture is old).  She is scheduled to see neuropsych in September.  Will plan to schedule EMG of the right > left legs for November 4th at 11am - patient has been notified.    Donika K. Posey Pronto, DO

## 2013-11-01 NOTE — Telephone Encounter (Signed)
Please advise 

## 2013-11-02 ENCOUNTER — Ambulatory Visit: Payer: BC Managed Care – PPO | Admitting: Neurology

## 2013-11-07 NOTE — Telephone Encounter (Signed)
I have made that appt for the EMG

## 2013-11-08 ENCOUNTER — Encounter: Payer: Self-pay | Admitting: Neurology

## 2013-12-27 ENCOUNTER — Telehealth: Payer: Self-pay | Admitting: *Deleted

## 2013-12-27 NOTE — Telephone Encounter (Signed)
Attempted to contact patient.  Left message that I would get back to her next week when Dr. Posey Pronto gets back.

## 2013-12-27 NOTE — Telephone Encounter (Signed)
Patient has a appointment for a EMG November 4 she has not had any tingling in about 4 week should she still have this test done. Please advise Call back # [phone]

## 2013-12-31 NOTE — Telephone Encounter (Signed)
Cell phone: [phone]

## 2014-01-01 NOTE — Telephone Encounter (Signed)
Patient notified and appointment cancelled.

## 2014-01-01 NOTE — Telephone Encounter (Signed)
Left message for patient to call me back. 

## 2014-01-01 NOTE — Telephone Encounter (Signed)
Please inform patient that she is asymptomatic for the past few weeks, would be reasonable to cancel EMG.  It can always be done if symptoms return.  Donika K. Posey Pronto, DO

## 2014-01-01 NOTE — Telephone Encounter (Signed)
Please advise 

## 2014-01-02 ENCOUNTER — Encounter: Payer: BC Managed Care – PPO | Admitting: Neurology

## 2014-01-17 ENCOUNTER — Telehealth: Payer: Self-pay | Admitting: Neurology

## 2014-01-17 NOTE — Telephone Encounter (Signed)
Pt canceled appt to see you on 01-28-14 she does not feel like she needs to come in

## 2014-01-28 ENCOUNTER — Ambulatory Visit: Payer: BC Managed Care – PPO | Admitting: Neurology

## 2014-01-30 ENCOUNTER — Ambulatory Visit: Payer: BC Managed Care – PPO | Admitting: Neurology

## 2014-03-01 HISTORY — PX: LAMINECTOMY AND MICRODISCECTOMY LUMBAR SPINE: SHX1913

## 2014-07-06 ENCOUNTER — Emergency Department (HOSPITAL_COMMUNITY): Payer: BLUE CROSS/BLUE SHIELD

## 2014-07-06 ENCOUNTER — Emergency Department (HOSPITAL_COMMUNITY)
Admission: EM | Admit: 2014-07-06 | Discharge: 2014-07-06 | Disposition: A | Discharge status: Home / Self Care | Payer: BLUE CROSS/BLUE SHIELD | Attending: Emergency Medicine | Admitting: Emergency Medicine

## 2014-07-06 ENCOUNTER — Encounter (HOSPITAL_COMMUNITY): Payer: Self-pay | Admitting: Emergency Medicine

## 2014-07-06 DIAGNOSIS — M549 Dorsalgia, unspecified: Secondary | ICD-10-CM | POA: Diagnosis not present

## 2014-07-06 DIAGNOSIS — Z8639 Personal history of other endocrine, nutritional and metabolic disease: Secondary | ICD-10-CM | POA: Diagnosis not present

## 2014-07-06 DIAGNOSIS — Z9851 Tubal ligation status: Secondary | ICD-10-CM | POA: Insufficient documentation

## 2014-07-06 DIAGNOSIS — F329 Major depressive disorder, single episode, unspecified: Secondary | ICD-10-CM | POA: Insufficient documentation

## 2014-07-06 DIAGNOSIS — K8020 Calculus of gallbladder without cholecystitis without obstruction: Secondary | ICD-10-CM | POA: Diagnosis not present

## 2014-07-06 DIAGNOSIS — R109 Unspecified abdominal pain: Secondary | ICD-10-CM

## 2014-07-06 DIAGNOSIS — Z3202 Encounter for pregnancy test, result negative: Secondary | ICD-10-CM | POA: Insufficient documentation

## 2014-07-06 DIAGNOSIS — Z79899 Other long term (current) drug therapy: Secondary | ICD-10-CM | POA: Diagnosis not present

## 2014-07-06 DIAGNOSIS — Z8742 Personal history of other diseases of the female genital tract: Secondary | ICD-10-CM | POA: Diagnosis not present

## 2014-07-06 DIAGNOSIS — Z87891 Personal history of nicotine dependence: Secondary | ICD-10-CM | POA: Diagnosis not present

## 2014-07-06 DIAGNOSIS — Z9049 Acquired absence of other specified parts of digestive tract: Secondary | ICD-10-CM | POA: Diagnosis not present

## 2014-07-06 LAB — CBC
HCT: 40.8 % (ref 36.0–46.0)
Hemoglobin: 14 g/dL (ref 12.0–15.0)
MCH: 32.9 pg (ref 26.0–34.0)
MCHC: 34.3 g/dL (ref 30.0–36.0)
MCV: 96 fL (ref 78.0–100.0)
Platelets: 269 10*3/uL (ref 150–400)
RBC: 4.25 MIL/uL (ref 3.87–5.11)
RDW: 12.7 % (ref 11.5–15.5)
WBC: 10 10*3/uL (ref 4.0–10.5)

## 2014-07-06 LAB — URINALYSIS, ROUTINE W REFLEX MICROSCOPIC
Bilirubin Urine: NEGATIVE
Glucose, UA: NEGATIVE mg/dL
Hgb urine dipstick: NEGATIVE
Ketones, ur: NEGATIVE mg/dL
Leukocytes, UA: NEGATIVE
Nitrite: NEGATIVE
Protein, ur: NEGATIVE mg/dL
Specific Gravity, Urine: 1.027 (ref 1.005–1.030)
Urobilinogen, UA: 0.2 mg/dL (ref 0.0–1.0)
pH: 5.5 (ref 5.0–8.0)

## 2014-07-06 LAB — HEPATIC FUNCTION PANEL
ALT: 16 U/L (ref 14–54)
AST: 21 U/L (ref 15–41)
Albumin: 3.7 g/dL (ref 3.5–5.0)
Alkaline Phosphatase: 52 U/L (ref 38–126)
Bilirubin, Direct: <0.1 mg/dL — ABNORMAL LOW (ref 0.1–0.5)
Total Bilirubin: 0.6 mg/dL (ref 0.3–1.2)
Total Protein: 7.1 g/dL (ref 6.5–8.1)

## 2014-07-06 LAB — LIPASE, BLOOD: Lipase: 40 U/L (ref 22–51)

## 2014-07-06 LAB — BASIC METABOLIC PANEL
Anion gap: 8 (ref 5–15)
BUN: 10 mg/dL (ref 6–20)
CO2: 22 mmol/L (ref 22–32)
Calcium: 8.5 mg/dL — ABNORMAL LOW (ref 8.9–10.3)
Chloride: 108 mmol/L (ref 101–111)
Creatinine, Ser: 0.8 mg/dL (ref 0.44–1.00)
GFR calc Af Amer: >60 mL/min (ref >60)
GFR calc non Af Amer: >60 mL/min (ref >60)
Glucose, Bld: 149 mg/dL — ABNORMAL HIGH (ref 70–99)
Potassium: 3.7 mmol/L (ref 3.5–5.1)
Sodium: 138 mmol/L (ref 135–145)

## 2014-07-06 LAB — POC URINE PREG, ED: Preg Test, Ur: NEGATIVE

## 2014-07-06 LAB — I-STAT TROPONIN, ED: Troponin i, poc: 0 ng/mL (ref 0.00–0.08)

## 2014-07-06 MED ORDER — NAPROXEN 500 MG PO TABS: 500.0000 mg | ORAL_TABLET | Freq: Two times a day (BID) | ORAL | Status: DC

## 2014-07-06 MED ORDER — ONDANSETRON HCL 4 MG/2ML IJ SOLN
4.0000 mg | Freq: Once | INTRAMUSCULAR | Status: AC
  Administered 2014-07-06: 4 mg via INTRAVENOUS
  Filled 2014-07-06: qty 2

## 2014-07-06 MED ORDER — HYDROMORPHONE HCL 1 MG/ML IJ SOLN
1.0000 mg | Freq: Once | INTRAMUSCULAR | Status: AC
  Administered 2014-07-06: 1 mg via INTRAVENOUS
  Filled 2014-07-06: qty 1

## 2014-07-06 MED ORDER — SODIUM CHLORIDE 0.9 % IV SOLN
INTRAVENOUS | Status: DC
  Administered 2014-07-06: 07:00:00 via INTRAVENOUS

## 2014-07-06 MED ORDER — OXYCODONE-ACETAMINOPHEN 5-325 MG PO TABS: 1.0000 | ORAL_TABLET | Freq: Four times a day (QID) | ORAL | Status: DC | PRN

## 2014-07-06 MED ORDER — OXYCODONE-ACETAMINOPHEN 5-325 MG PO TABS
1.0000 | ORAL_TABLET | Freq: Once | ORAL | Status: AC
  Administered 2014-07-06: 1 via ORAL
  Filled 2014-07-06: qty 1

## 2014-07-06 MED ORDER — KETOROLAC TROMETHAMINE 15 MG/ML IJ SOLN
15.0000 mg | Freq: Once | INTRAMUSCULAR | Status: AC
  Administered 2014-07-06: 15 mg via INTRAVENOUS
  Filled 2014-07-06: qty 1

## 2014-07-06 MED ORDER — ONDANSETRON 4 MG PO TBDP: 4.0000 mg | ORAL_TABLET | Freq: Three times a day (TID) | ORAL | Status: DC | PRN

## 2014-07-06 NOTE — ED Notes (Signed)
Pt. On cardiac monitor. 

## 2014-07-06 NOTE — ED Provider Notes (Signed)
CSN: [id number]     Arrival date & time 07/06/14  0559 History   First MD Initiated Contact with Patient 07/06/14 0636     Chief Complaint  Patient presents with  . Chest Pain     (Consider location/radiation/quality/duration/timing/severity/associated sxs/prior Treatment) HPI Comments: Patients with history of appendectomy, ectopic pregnancy -- presents with complaint of acute onset of back pain and abdominal pain which awoke her from sleep approximately one hour prior to arrival. Pain is described as burning in nature. Patient was asymptomatic when she went to bed last night and has never had pain like this in the past. It is been associated with one episode of vomiting. No fevers. Patient states that she does have increased pain in her back when she takes a deep breath. No cough or hemoptysis. No urinary symptoms including hematuria. Patient denies risk factors for pulmonary embolism including: unilateral leg swelling, history of DVT/PE/other blood clots, use of estrogens, recent immobilizations, recent surgery, recent travel (>4hr segment), malignancy, hemoptysis. The onset of this condition was acute. The course is constant. Aggravating factors: none. Alleviating factors: none.      Patient is a 49 y.o. female presenting with chest pain. The history is provided by the patient.  Chest Pain Associated symptoms: abdominal pain and back pain   Associated symptoms: no cough, no fever, no headache, no nausea and not vomiting     Past Medical History  Diagnosis Date  . Hyperlipidemia   . Depression   . IBS (irritable bowel syndrome)   . Endometriosis    Past Surgical History  Procedure Laterality Date  . Shoulder surgery Bilateral   . Tubal ligation    . Ectopic pregnancy surgery    . Laparoscopy     Family History  Problem Relation Age of Onset  . Prostate cancer Father     Living, 62  . Bladder Cancer Mother     Living, 66  . Hypertension Mother   . Aneurysm Mother     brain   . Colon cancer Maternal Grandfather   . Healthy Brother     x 2  . Healthy Daughter    History  Substance Use Topics  . Smoking status: Former Research scientist (life sciences)  . Smokeless tobacco: Not on file     Comment: Quit >24 yrs ago  . Alcohol Use: No   OB History    No data available     Review of Systems  Constitutional: Negative for fever.  HENT: Negative for rhinorrhea and sore throat.   Eyes: Negative for redness.  Respiratory: Negative for cough.   Cardiovascular: Positive for chest pain.  Gastrointestinal: Positive for abdominal pain. Negative for nausea, vomiting and diarrhea.  Genitourinary: Positive for flank pain. Negative for dysuria, frequency, hematuria and pelvic pain.  Musculoskeletal: Positive for back pain. Negative for myalgias.  Skin: Negative for rash.  Neurological: Negative for headaches.    Allergies  Review of patient's allergies indicates no known allergies.  Home Medications   Prior to Admission medications   Medication Sig Start Date End Date Taking? Authorizing Provider  buPROPion (WELLBUTRIN XL) 150 MG 24 hr tablet Take 150 mg by mouth daily.  10/14/13  Yes Historical Provider, MD  drospirenone-ethinyl estradiol (YASMIN,ZARAH,SYEDA) 3-0.03 MG tablet Take 1 tablet by mouth daily.   Yes Historical Provider, MD  etonogestrel-ethinyl estradiol (NUVARING) 0.12-0.015 MG/24HR vaginal ring Place 1 each vaginally every 28 (twenty-eight) days. Insert vaginally and leave in place for 3 consecutive weeks, then remove for 1 week.  Yes Historical Provider, MD  lamoTRIgine (LAMICTAL) 200 MG tablet Take 200 mg by mouth daily.   Yes Historical Provider, MD  escitalopram (LEXAPRO) 20 MG tablet  10/14/13   Historical Provider, MD  traMADol (ULTRAM) 50 MG tablet Take by mouth every 6 (six) hours as needed.    Historical Provider, MD   BP 113/61 mmHg  Pulse 74  Resp 13  SpO2 100%   Physical Exam  Constitutional: She appears well-developed and well-nourished.  HENT:  Head:  Normocephalic and atraumatic.  Mouth/Throat: Oropharynx is clear and moist.  Eyes: Conjunctivae are normal. Right eye exhibits no discharge. Left eye exhibits no discharge.  Neck: Normal range of motion. Neck supple.  Cardiovascular: Normal rate, regular rhythm and normal heart sounds.   Pulmonary/Chest: Effort normal and breath sounds normal. No respiratory distress. She has no rales.  Abdominal: Soft. Normal appearance and bowel sounds are normal. She exhibits no distension. There is tenderness in the right upper quadrant and epigastric area. There is no rebound, no guarding and no CVA tenderness.  Neurological: She is alert.  Skin: Skin is warm and dry.  Psychiatric: She has a normal mood and affect.  Nursing note and vitals reviewed.   ED Course  Procedures (including critical care time) Labs Review Labs Reviewed  BASIC METABOLIC PANEL - Abnormal; Notable for the following:    Glucose, Bld 149 (*)    Calcium 8.5 (*)    All other components within normal limits  HEPATIC FUNCTION PANEL - Abnormal; Notable for the following:    Bilirubin, Direct <0.1 (*)    All other components within normal limits  URINALYSIS, ROUTINE W REFLEX MICROSCOPIC - Abnormal; Notable for the following:    APPearance CLOUDY (*)    All other components within normal limits  CBC  LIPASE, BLOOD  I-STAT TROPOININ, ED  POC URINE PREG, ED    Imaging Review US Abdomen Complete  07/06/2014   CLINICAL DATA:  Abdominal pain and back pain starting this morning. History of irritable bowel syndrome.  EXAM: ULTRASOUND ABDOMEN COMPLETE  COMPARISON:  Ultrasound, 09/22/2011.  FINDINGS: Gallbladder: Dependent gallstones. No wall thickening or pericholecystic fluid. No evidence of acute cholecystitis.  Common bile duct: Diameter: 3.2 mm  Liver: Heterogeneous lesion in the lateral segment of the left lobe measuring 3.1 cm x 1.6 cm x 2.5 cm. 12 mm cyst in the right lobe. No other liver mass or lesion. Liver normal parenchymal  echogenicity. Normal hepatopetal flow in the portal vein.  IVC: No abnormality visualized.  Pancreas: Visualized portion unremarkable.  Spleen: Size and appearance within normal limits.  Right Kidney: Length: 10.7 cm. Echogenicity within normal limits. No mass or hydronephrosis visualized.  Left Kidney: Length: 11.1 cm. Echogenicity within normal limits. No mass or hydronephrosis visualized.  Abdominal aorta: No aneurysm visualized.  Other findings: None.  IMPRESSION: 1. Cholelithiasis without evidence of acute cholecystitis. 2. Heterogeneous lesion in the lateral segment of the left lobe measuring 3.1 cm. This measures smaller than on the prior ultrasound. This is a benign lesion given its lack of enlargement since July 2013. Stable benign cyst in the inferior right lobe. 3. No other abnormalities.  No acute findings.   Electronically Signed   By: Lajean Manes M.D.   On: 07/06/2014 09:24   Dg Chest Port 1 View  07/06/2014   CLINICAL DATA:  Chest pain and vomiting for 1 hour.  EXAM: PORTABLE CHEST - 1 VIEW  COMPARISON:  None.  FINDINGS: Cardiomediastinal silhouette is unremarkable for  this low inspiratory portable examination with crowded vasculature markings. The lungs are clear without pleural effusions or focal consolidations. Trachea projects midline and there is no pneumothorax. Included soft tissue planes and osseous structures are non-suspicious. Remote LEFT mid clavicle fracture.  IMPRESSION: No acute cardiopulmonary process.   Electronically Signed   By: Elon Alas   On: 07/06/2014 06:38     EKG Interpretation   Date/Time:  Saturday Jul 06 2014 06:14:17 EDT Ventricular Rate:  61 PR Interval:  105 QRS Duration: 92 QT Interval:  433 QTC Calculation: 436 R Axis:   79 Text Interpretation:  Sinus rhythm Short PR interval Baseline wander in  lead(s) V2 Confirmed by HORTON  MD, COURTNEY ([id number]) on 07/06/2014 6:36:40  AM       Patient seen and examined. Work-up initiated. Medications  ordered.   Vital signs reviewed and are as follows: BP 113/61 mmHg  Pulse 74  Resp 13  SpO2 100%  Discussed with Dr. Jeneen Rinks. Awaiting completion of work-up.    11:16 AM Patient feeling much better. Discussed with and seen by Dr. Jeneen Rinks. Plan for discharge to home with PCP follow-up in 3 days if not improved.  The patient was urged to return to the Emergency Department immediately with worsening of current symptoms, worsening abdominal pain, persistent vomiting, blood noted in stools, fever, shortness of breath or trouble breathing or any other concerns. The patient verbalized understanding.   Patient counseled on use of narcotic pain medications. Counseled not to combine these medications with others containing tylenol. Urged not to drink alcohol, drive, or perform any other activities that requires focus while taking these medications. The patient verbalizes understanding and agrees with the plan.   MDM   Final diagnoses:  Abdominal pain  Calculus of gallbladder without cholecystitis without obstruction   Patient with back/RUQ abd pain with some pleuritic component -- PERC neg per history (pt denies current estrogens). US shows gallstones. Labs otherwise reassuring. Pain controlled in ED. Patient comfortable with discharge home with PCP follow-up as described above.  No dangerous or life-threatening conditions suspected or identified by history, physical exam, and by work-up. No indications for hospitalization identified.      Carlisle Cater, PA-C 07/06/14 [place], MD 07/09/14 [phone]

## 2014-07-06 NOTE — Discharge Instructions (Signed)
Please read and follow all provided instructions.  Your diagnoses today include:  1. Abdominal pain     Tests performed today include:  Blood counts and electrolytes  Blood tests to check liver and kidney function  Blood tests to check pancreas function  Urine test to look for infection and pregnancy (in women)  Chest x-ray - clear  Abdominal ultrasound - shows gallstones without gallbladder infection  Vital signs. See below for your results today.   Medications prescribed:   Percocet (oxycodone/acetaminophen) - narcotic pain medication  DO NOT drive or perform any activities that require you to be awake and alert because this medicine can make you drowsy. BE VERY CAREFUL not to take multiple medicines containing Tylenol (also called acetaminophen). Doing so can lead to an overdose which can damage your liver and cause liver failure and possibly death.   Naproxen - anti-inflammatory pain medication  Do not exceed 500mg  naproxen every 12 hours, take with food  You have been prescribed an anti-inflammatory medication or NSAID. Take with food. Take smallest effective dose for the shortest duration needed for your pain. Stop taking if you experience stomach pain or vomiting.    Zofran (ondansetron) - for nausea and vomiting  Take any prescribed medications only as directed.  Home care instructions:   Follow any educational materials contained in this packet.  Follow-up instructions: Please follow-up with your primary care provider in the next 3 days for further evaluation of your symptoms.    Return instructions:  SEEK IMMEDIATE MEDICAL ATTENTION IF:  The pain does not go away or becomes severe   A temperature above 101F develops   Repeated vomiting occurs (multiple episodes)   The pain becomes localized to portions of the abdomen. The right side could possibly be appendicitis. In an adult, the left lower portion of the abdomen could be colitis or diverticulitis.     Blood is being passed in stools or vomit (bright red or black tarry stools)   You develop chest pain, difficulty breathing, dizziness or fainting, or become confused, poorly responsive, or inconsolable (young children)  If you have any other emergent concerns regarding your health  Additional Information: Abdominal (belly) pain can be caused by many things. Your caregiver performed an examination and possibly ordered blood/urine tests and imaging (CT scan, x-rays, ultrasound). Many cases can be observed and treated at home after initial evaluation in the emergency department. Even though you are being discharged home, abdominal pain can be unpredictable. Therefore, you need a repeated exam if your pain does not resolve, returns, or worsens. Most patients with abdominal pain don't have to be admitted to the hospital or have surgery, but serious problems like appendicitis and gallbladder attacks can start out as nonspecific pain. Many abdominal conditions cannot be diagnosed in one visit, so follow-up evaluations are very important.  Your vital signs today were: BP 118/67 mmHg   Pulse 78   Resp 16   SpO2 97% If your blood pressure (bp) was elevated above 135/85 this visit, please have this repeated by your doctor within one month. --------------

## 2014-07-06 NOTE — ED Notes (Signed)
Chest tightness x 1 hr radiating to back. Pt vomiting.

## 2014-07-06 NOTE — ED Notes (Addendum)
Repeat EKG given to EDP, Horton,MD., for review.

## 2014-08-29 ENCOUNTER — Other Ambulatory Visit: Payer: Self-pay | Admitting: Neurosurgery

## 2014-08-29 DIAGNOSIS — M47816 Spondylosis without myelopathy or radiculopathy, lumbar region: Secondary | ICD-10-CM

## 2014-09-03 ENCOUNTER — Ambulatory Visit
Admission: RE | Admit: 2014-09-03 | Discharge: 2014-09-03 | Disposition: A | Discharge status: Home / Self Care | Payer: BLUE CROSS/BLUE SHIELD | Source: Ambulatory Visit | Attending: Neurosurgery | Admitting: Neurosurgery

## 2014-09-03 DIAGNOSIS — M47816 Spondylosis without myelopathy or radiculopathy, lumbar region: Secondary | ICD-10-CM

## 2014-09-03 MED ORDER — IOHEXOL 180 MG/ML  SOLN
1.0000 mL | Freq: Once | INTRAMUSCULAR | Status: AC | PRN
  Administered 2014-09-03: 1 mL via EPIDURAL

## 2014-09-03 MED ORDER — METHYLPREDNISOLONE ACETATE 40 MG/ML INJ SUSP (RADIOLOG
120.0000 mg | Freq: Once | INTRAMUSCULAR | Status: AC
  Administered 2014-09-03: 120 mg via EPIDURAL

## 2014-09-03 NOTE — Discharge Instructions (Signed)

## 2014-09-18 ENCOUNTER — Other Ambulatory Visit: Payer: Self-pay | Admitting: Obstetrics and Gynecology

## 2014-09-19 LAB — CYTOLOGY - PAP

## 2014-09-20 ENCOUNTER — Other Ambulatory Visit: Payer: Self-pay | Admitting: Neurosurgery

## 2014-09-20 DIAGNOSIS — M47816 Spondylosis without myelopathy or radiculopathy, lumbar region: Secondary | ICD-10-CM

## 2014-10-01 ENCOUNTER — Other Ambulatory Visit: Payer: Self-pay | Admitting: Neurosurgery

## 2014-10-01 ENCOUNTER — Ambulatory Visit
Admission: RE | Admit: 2014-10-01 | Discharge: 2014-10-01 | Disposition: A | Discharge status: Home / Self Care | Payer: BLUE CROSS/BLUE SHIELD | Source: Ambulatory Visit | Attending: Neurosurgery | Admitting: Neurosurgery

## 2014-10-01 DIAGNOSIS — M47816 Spondylosis without myelopathy or radiculopathy, lumbar region: Secondary | ICD-10-CM

## 2014-10-01 MED ORDER — IOHEXOL 180 MG/ML  SOLN
1.0000 mL | Freq: Once | INTRAMUSCULAR | Status: AC | PRN
  Administered 2014-10-01: 1 mL via INTRAVENOUS

## 2014-10-01 MED ORDER — METHYLPREDNISOLONE ACETATE 40 MG/ML INJ SUSP (RADIOLOG
120.0000 mg | Freq: Once | INTRAMUSCULAR | Status: AC
  Administered 2014-10-01: 120 mg via EPIDURAL

## 2014-11-07 ENCOUNTER — Other Ambulatory Visit: Payer: Self-pay | Admitting: Neurosurgery

## 2014-11-07 ENCOUNTER — Ambulatory Visit
Admission: RE | Admit: 2014-11-07 | Discharge: 2014-11-07 | Disposition: A | Discharge status: Home / Self Care | Payer: BLUE CROSS/BLUE SHIELD | Source: Ambulatory Visit | Attending: Neurosurgery | Admitting: Neurosurgery

## 2014-11-07 DIAGNOSIS — M47816 Spondylosis without myelopathy or radiculopathy, lumbar region: Secondary | ICD-10-CM

## 2015-02-06 ENCOUNTER — Other Ambulatory Visit: Payer: Self-pay | Admitting: Neurosurgery

## 2015-02-06 DIAGNOSIS — M47817 Spondylosis without myelopathy or radiculopathy, lumbosacral region: Secondary | ICD-10-CM

## 2015-02-10 ENCOUNTER — Ambulatory Visit
Admission: RE | Admit: 2015-02-10 | Discharge: 2015-02-10 | Disposition: A | Discharge status: Home / Self Care | Payer: BLUE CROSS/BLUE SHIELD | Source: Ambulatory Visit | Attending: Neurosurgery | Admitting: Neurosurgery

## 2015-02-10 DIAGNOSIS — M47817 Spondylosis without myelopathy or radiculopathy, lumbosacral region: Secondary | ICD-10-CM

## 2015-02-10 MED ORDER — GADOBENATE DIMEGLUMINE 529 MG/ML IV SOLN
14.0000 mL | Freq: Once | INTRAVENOUS | Status: AC | PRN
  Administered 2015-02-10: 14 mL via INTRAVENOUS

## 2015-06-02 ENCOUNTER — Emergency Department (HOSPITAL_COMMUNITY)
Admission: EM | Admit: 2015-06-02 | Discharge: 2015-06-02 | Disposition: A | Discharge status: Home / Self Care | Payer: BLUE CROSS/BLUE SHIELD | Attending: Emergency Medicine | Admitting: Emergency Medicine

## 2015-06-02 ENCOUNTER — Encounter (HOSPITAL_COMMUNITY): Payer: Self-pay | Admitting: Emergency Medicine

## 2015-06-02 DIAGNOSIS — Z8639 Personal history of other endocrine, nutritional and metabolic disease: Secondary | ICD-10-CM | POA: Insufficient documentation

## 2015-06-02 DIAGNOSIS — M545 Low back pain, unspecified: Secondary | ICD-10-CM

## 2015-06-02 DIAGNOSIS — Z79899 Other long term (current) drug therapy: Secondary | ICD-10-CM | POA: Insufficient documentation

## 2015-06-02 DIAGNOSIS — F329 Major depressive disorder, single episode, unspecified: Secondary | ICD-10-CM | POA: Insufficient documentation

## 2015-06-02 DIAGNOSIS — Z8719 Personal history of other diseases of the digestive system: Secondary | ICD-10-CM | POA: Insufficient documentation

## 2015-06-02 DIAGNOSIS — Z9889 Other specified postprocedural states: Secondary | ICD-10-CM | POA: Insufficient documentation

## 2015-06-02 DIAGNOSIS — M549 Dorsalgia, unspecified: Secondary | ICD-10-CM | POA: Diagnosis present

## 2015-06-02 DIAGNOSIS — Z8742 Personal history of other diseases of the female genital tract: Secondary | ICD-10-CM | POA: Insufficient documentation

## 2015-06-02 MED ORDER — PREDNISONE 10 MG (21) PO TBPK: 10.0000 mg | ORAL_TABLET | Freq: Every day | ORAL | Status: DC

## 2015-06-02 NOTE — ED Provider Notes (Signed)
CSN: [id number]     Arrival date & time 06/02/15  1853 History   First MD Initiated Contact with Patient 06/02/15 2200     No chief complaint on file.  (Consider location/radiation/quality/duration/timing/severity/associated sxs/prior Treatment) HPI 50 y.o. female with a hx of Back Surgery in September, presents to the Emergency Department today complaining of shooting pains down her right leg. States that she has been having ongoing nerve pain since her Surgery that has been treated with narcotics and gabapentin. States that since Wednesday, the pain has increase and is debilitating. With pain 10/10 and feels like "zaps" from her back to her middle thigh. No loss of bowel or bladder function. No motor/sensory deficits. No saddle anesthesia. No fevers. No spinal tenderness. Pt able to ambulate without difficulty. No other symptoms.    Past Medical History  Diagnosis Date  . Hyperlipidemia   . Depression   . IBS (irritable bowel syndrome)   . Endometriosis    Past Surgical History  Procedure Laterality Date  . Shoulder surgery Bilateral   . Tubal ligation    . Ectopic pregnancy surgery    . Laparoscopy    . Back surgery     Family History  Problem Relation Age of Onset  . Prostate cancer Father     Living, 71  . Bladder Cancer Mother     Living, 63  . Hypertension Mother   . Aneurysm Mother     brain  . Colon cancer Maternal Grandfather   . Healthy Brother     x 2  . Healthy Daughter    Social History  Substance Use Topics  . Smoking status: Former Research scientist (life sciences)  . Smokeless tobacco: None     Comment: Quit >24 yrs ago  . Alcohol Use: No   OB History    No data available     Review of Systems ROS reviewed and all are negative for acute change except as noted in the HPI.  Allergies  Review of patient's allergies indicates no known allergies.  Home Medications   Prior to Admission medications   Medication Sig Start Date End Date Taking? Authorizing Provider   Brexpiprazole (REXULTI) 1 MG TABS Take 1 mg by mouth daily.   Yes Historical Provider, MD  buPROPion (WELLBUTRIN XL) 300 MG 24 hr tablet Take 300 mg by mouth daily.   Yes Historical Provider, MD  docusate sodium (COLACE) 100 MG capsule Take 200 mg by mouth 2 (two) times daily.   Yes Historical Provider, MD  FLUoxetine (PROZAC) 20 MG capsule Take 20 mg by mouth daily.   Yes Historical Provider, MD  gabapentin (NEURONTIN) 300 MG capsule Take 300 mg by mouth 3 (three) times daily.   Yes Historical Provider, MD  lamoTRIgine (LAMICTAL) 200 MG tablet Take 200 mg by mouth daily.   Yes Historical Provider, MD  levonorgestrel-ethinyl estradiol (SEASONALE,INTROVALE,JOLESSA) 0.15-0.03 MG tablet Take 1 tablet by mouth daily.   Yes Historical Provider, MD  oxyCODONE-acetaminophen (PERCOCET/ROXICET) 5-325 MG per tablet Take 1-2 tablets by mouth every 6 (six) hours as needed for severe pain. 07/06/14  Yes Joshua Geiple, PA-C   BP 116/71 mmHg  Pulse 93  Temp(Src) 98.4 F (36.9 C) (Oral)  Resp 20  SpO2 98%   Physical Exam  Constitutional: She is oriented to person, place, and time. She appears well-developed and well-nourished.  HENT:  Head: Normocephalic and atraumatic.  Eyes: EOM are normal. Pupils are equal, round, and reactive to light.  Neck: Normal range of motion. Neck supple.  No tracheal deviation present.  Cardiovascular: Normal rate, regular rhythm and normal heart sounds.   No murmur heard. Pulmonary/Chest: Effort normal and breath sounds normal. No respiratory distress. She has no wheezes. She has no rales. She exhibits no tenderness.  Abdominal: Soft. There is no tenderness.  Musculoskeletal: Normal range of motion.       Lumbar back: She exhibits normal range of motion, no tenderness, no swelling, no deformity and no pain.  Neurological: She is alert and oriented to person, place, and time.  Cranial Nerves:  II: Pupils equal, round, reactive to light III,IV, VI: ptosis not present,  extra-ocular motions intact bilaterally  V,VII: smile symmetric, facial light touch sensation equal VIII: hearing grossly normal bilaterally  IX,X: midline uvula rise  XI: bilateral shoulder shrug equal and strong XII: midline tongue extension  Skin: Skin is warm and dry.  Psychiatric: She has a normal mood and affect. Her behavior is normal. Thought content normal.  Nursing note and vitals reviewed.  ED Course  Procedures (including critical care time) Labs Review Labs Reviewed - No data to display  Imaging Review No results found. I have personally reviewed and evaluated these images and lab results as part of my medical decision-making.   EKG Interpretation None      MDM  I have reviewed and evaluated the relevant imaging studies. I have reviewed the relevant previous healthcare records. I obtained HPI from historian. Patient discussed with supervising physician  ED Course:  Assessment: Patient with back pain. No neurological deficits appreciated. Patient is ambulatory. No warning symptoms of back pain including: fecal incontinence, urinary retention or overflow incontinence, night sweats, waking from sleep with back pain, unexplained fevers or weight loss, h/o cancer, IVDU, recent trauma. No concern for cauda equina, epidural abscess, or other serious cause of back pain. Conservative measures such as rest, ice/heat and pain medicine indicated with PCP follow-up if no improvement with conservative management. Given Rx for Steroid Dose Pack.    Disposition/Plan:  DC Home Additional Verbal discharge instructions given and discussed with patient.  Pt Instructed to f/u with PCP in the next 48 hours for evaluation and treatment of symptoms. Return precautions given Pt acknowledges and agrees with plan  Supervising Physician Forde Dandy, MD   Final diagnoses:  Right-sided low back pain without sciatica        Shary Decamp, PA-C 06/02/15 [place] Liu,  MD 06/03/15 [phone]

## 2015-06-02 NOTE — ED Notes (Addendum)
Correction to triage ED note. Pt has hx of back surgery for a bulging disc last September. Pt has experienced pain in her back and upper right leg since. Pt states that the pain NEVER shoots down her R leg. It always starts in the middle of her right thigh and shoots upward. Pt describes this pain in her back and leg as lightning bolts. PT has been out of work since Wednesday and states her prescribed pain medicine is not touching her pain. Her PCP advised her to come here.

## 2015-06-02 NOTE — ED Notes (Signed)
Pt has hx of back surgery in September and since then has struggled with nerve pain. Pt sts pain is usually controlled with her prescribed oxycodone but this pain is severe. Pt sts usually the pain shoots down her R leg but "it's actually shooting up into my R thigh." A&Ox4 and ambulatory. Pt called PCP and they recommended she come here. Pt's last dose of oxycodone was three hours ago.

## 2015-06-02 NOTE — ED Notes (Signed)
PA at bedside.

## 2015-06-02 NOTE — Discharge Instructions (Signed)
Please read and follow all provided instructions.  Your diagnoses today include:  1. Right-sided low back pain without sciatica    Tests performed today include:  Vital signs - see below for your results today  Medications prescribed:   Take any prescribed medications only as directed.  Home care instructions:   Follow any educational materials contained in this packet  Please rest, use ice or heat on your back for the next several days  Do not lift, push, pull anything more than 10 pounds for the next week  Follow-up instructions: Please follow-up with your primary care provider in the next 1 week for further evaluation of your symptoms.   Return instructions:  SEEK IMMEDIATE MEDICAL ATTENTION IF YOU HAVE:  New numbness, tingling, weakness, or problem with the use of your arms or legs  Severe back pain not relieved with medications  Loss control of your bowels or bladder  Increasing pain in any areas of the body (such as chest or abdominal pain)  Shortness of breath, dizziness, or fainting.   Worsening nausea (feeling sick to your stomach), vomiting, fever, or sweats  Any other emergent concerns regarding your health   Additional Information:  Your vital signs today were: BP 116/71 mmHg   Pulse 93   Temp(Src) 98.4 F (36.9 C) (Oral)   Resp 20   SpO2 98% If your blood pressure (BP) was elevated above 135/85 this visit, please have this repeated by your doctor within one month. --------------

## 2015-06-04 DIAGNOSIS — M47817 Spondylosis without myelopathy or radiculopathy, lumbosacral region: Secondary | ICD-10-CM | POA: Diagnosis not present

## 2015-06-04 DIAGNOSIS — M8448XD Pathological fracture, other site, subsequent encounter for fracture with routine healing: Secondary | ICD-10-CM | POA: Diagnosis not present

## 2015-06-04 DIAGNOSIS — M5116 Intervertebral disc disorders with radiculopathy, lumbar region: Secondary | ICD-10-CM | POA: Diagnosis not present

## 2015-06-06 ENCOUNTER — Other Ambulatory Visit: Payer: Self-pay | Admitting: Neurosurgery

## 2015-06-06 DIAGNOSIS — M47816 Spondylosis without myelopathy or radiculopathy, lumbar region: Secondary | ICD-10-CM

## 2015-06-17 ENCOUNTER — Ambulatory Visit
Admission: RE | Admit: 2015-06-17 | Discharge: 2015-06-17 | Disposition: A | Discharge status: Home / Self Care | Payer: BLUE CROSS/BLUE SHIELD | Source: Ambulatory Visit | Attending: Neurosurgery | Admitting: Neurosurgery

## 2015-06-17 DIAGNOSIS — M5126 Other intervertebral disc displacement, lumbar region: Secondary | ICD-10-CM | POA: Diagnosis not present

## 2015-06-17 DIAGNOSIS — M47816 Spondylosis without myelopathy or radiculopathy, lumbar region: Secondary | ICD-10-CM

## 2015-06-25 DIAGNOSIS — M549 Dorsalgia, unspecified: Secondary | ICD-10-CM | POA: Diagnosis not present

## 2015-06-25 DIAGNOSIS — R0600 Dyspnea, unspecified: Secondary | ICD-10-CM | POA: Diagnosis not present

## 2015-06-25 DIAGNOSIS — M546 Pain in thoracic spine: Secondary | ICD-10-CM | POA: Diagnosis not present

## 2015-06-25 DIAGNOSIS — F329 Major depressive disorder, single episode, unspecified: Secondary | ICD-10-CM | POA: Diagnosis not present

## 2015-06-25 DIAGNOSIS — R110 Nausea: Secondary | ICD-10-CM | POA: Diagnosis not present

## 2015-06-25 DIAGNOSIS — R109 Unspecified abdominal pain: Secondary | ICD-10-CM | POA: Diagnosis not present

## 2015-06-30 DIAGNOSIS — M47817 Spondylosis without myelopathy or radiculopathy, lumbosacral region: Secondary | ICD-10-CM | POA: Diagnosis not present

## 2015-06-30 DIAGNOSIS — M5116 Intervertebral disc disorders with radiculopathy, lumbar region: Secondary | ICD-10-CM | POA: Diagnosis not present

## 2015-07-14 DIAGNOSIS — R109 Unspecified abdominal pain: Secondary | ICD-10-CM | POA: Diagnosis not present

## 2015-07-31 DIAGNOSIS — K602 Anal fissure, unspecified: Secondary | ICD-10-CM | POA: Diagnosis not present

## 2015-08-12 DIAGNOSIS — M5116 Intervertebral disc disorders with radiculopathy, lumbar region: Secondary | ICD-10-CM | POA: Diagnosis not present

## 2015-08-12 DIAGNOSIS — M47817 Spondylosis without myelopathy or radiculopathy, lumbosacral region: Secondary | ICD-10-CM | POA: Diagnosis not present

## 2015-08-25 DIAGNOSIS — M4716 Other spondylosis with myelopathy, lumbar region: Secondary | ICD-10-CM | POA: Diagnosis not present

## 2015-08-28 DIAGNOSIS — Z79891 Long term (current) use of opiate analgesic: Secondary | ICD-10-CM | POA: Diagnosis not present

## 2015-08-28 DIAGNOSIS — F419 Anxiety disorder, unspecified: Secondary | ICD-10-CM | POA: Diagnosis not present

## 2015-08-28 DIAGNOSIS — M4726 Other spondylosis with radiculopathy, lumbar region: Secondary | ICD-10-CM | POA: Diagnosis not present

## 2015-08-28 DIAGNOSIS — Z79899 Other long term (current) drug therapy: Secondary | ICD-10-CM | POA: Diagnosis not present

## 2015-08-28 DIAGNOSIS — M5116 Intervertebral disc disorders with radiculopathy, lumbar region: Secondary | ICD-10-CM | POA: Diagnosis not present

## 2015-08-28 DIAGNOSIS — M47896 Other spondylosis, lumbar region: Secondary | ICD-10-CM | POA: Diagnosis not present

## 2015-08-28 DIAGNOSIS — M5126 Other intervertebral disc displacement, lumbar region: Secondary | ICD-10-CM | POA: Diagnosis not present

## 2015-08-28 DIAGNOSIS — M5136 Other intervertebral disc degeneration, lumbar region: Secondary | ICD-10-CM | POA: Diagnosis not present

## 2015-08-28 DIAGNOSIS — F329 Major depressive disorder, single episode, unspecified: Secondary | ICD-10-CM | POA: Diagnosis not present

## 2015-09-04 DIAGNOSIS — M5136 Other intervertebral disc degeneration, lumbar region: Secondary | ICD-10-CM | POA: Diagnosis not present

## 2015-09-04 DIAGNOSIS — M47817 Spondylosis without myelopathy or radiculopathy, lumbosacral region: Secondary | ICD-10-CM | POA: Diagnosis not present

## 2015-09-04 DIAGNOSIS — Z981 Arthrodesis status: Secondary | ICD-10-CM | POA: Diagnosis not present

## 2015-09-10 DIAGNOSIS — F418 Other specified anxiety disorders: Secondary | ICD-10-CM | POA: Diagnosis not present

## 2015-09-10 DIAGNOSIS — F331 Major depressive disorder, recurrent, moderate: Secondary | ICD-10-CM | POA: Diagnosis not present

## 2015-09-10 DIAGNOSIS — F39 Unspecified mood [affective] disorder: Secondary | ICD-10-CM | POA: Diagnosis not present

## 2015-09-10 DIAGNOSIS — F341 Dysthymic disorder: Secondary | ICD-10-CM | POA: Diagnosis not present

## 2015-09-19 DIAGNOSIS — Z01419 Encounter for gynecological examination (general) (routine) without abnormal findings: Secondary | ICD-10-CM | POA: Diagnosis not present

## 2015-09-19 DIAGNOSIS — Z1231 Encounter for screening mammogram for malignant neoplasm of breast: Secondary | ICD-10-CM | POA: Diagnosis not present

## 2015-09-19 DIAGNOSIS — Z6825 Body mass index (BMI) 25.0-25.9, adult: Secondary | ICD-10-CM | POA: Diagnosis not present

## 2015-09-24 DIAGNOSIS — F332 Major depressive disorder, recurrent severe without psychotic features: Secondary | ICD-10-CM | POA: Diagnosis not present

## 2015-10-01 DIAGNOSIS — F332 Major depressive disorder, recurrent severe without psychotic features: Secondary | ICD-10-CM | POA: Diagnosis not present

## 2015-10-20 DIAGNOSIS — R040 Epistaxis: Secondary | ICD-10-CM | POA: Diagnosis not present

## 2015-10-20 DIAGNOSIS — L239 Allergic contact dermatitis, unspecified cause: Secondary | ICD-10-CM | POA: Diagnosis not present

## 2015-10-22 DIAGNOSIS — F332 Major depressive disorder, recurrent severe without psychotic features: Secondary | ICD-10-CM | POA: Diagnosis not present

## 2015-10-29 DIAGNOSIS — F332 Major depressive disorder, recurrent severe without psychotic features: Secondary | ICD-10-CM | POA: Diagnosis not present

## 2015-11-04 DIAGNOSIS — J3489 Other specified disorders of nose and nasal sinuses: Secondary | ICD-10-CM | POA: Diagnosis not present

## 2015-11-04 DIAGNOSIS — R52 Pain, unspecified: Secondary | ICD-10-CM | POA: Diagnosis not present

## 2015-11-11 DIAGNOSIS — J3489 Other specified disorders of nose and nasal sinuses: Secondary | ICD-10-CM | POA: Diagnosis not present

## 2015-11-11 DIAGNOSIS — R52 Pain, unspecified: Secondary | ICD-10-CM | POA: Diagnosis not present

## 2015-11-13 DIAGNOSIS — F332 Major depressive disorder, recurrent severe without psychotic features: Secondary | ICD-10-CM | POA: Diagnosis not present

## 2015-11-23 DIAGNOSIS — Z23 Encounter for immunization: Secondary | ICD-10-CM | POA: Diagnosis not present

## 2015-11-26 DIAGNOSIS — F332 Major depressive disorder, recurrent severe without psychotic features: Secondary | ICD-10-CM | POA: Diagnosis not present

## 2015-12-10 DIAGNOSIS — F332 Major depressive disorder, recurrent severe without psychotic features: Secondary | ICD-10-CM | POA: Diagnosis not present

## 2015-12-17 DIAGNOSIS — S83241A Other tear of medial meniscus, current injury, right knee, initial encounter: Secondary | ICD-10-CM | POA: Diagnosis not present

## 2015-12-31 DIAGNOSIS — F332 Major depressive disorder, recurrent severe without psychotic features: Secondary | ICD-10-CM | POA: Diagnosis not present

## 2016-01-14 DIAGNOSIS — F418 Other specified anxiety disorders: Secondary | ICD-10-CM | POA: Diagnosis not present

## 2016-01-14 DIAGNOSIS — F39 Unspecified mood [affective] disorder: Secondary | ICD-10-CM | POA: Diagnosis not present

## 2016-01-14 DIAGNOSIS — F331 Major depressive disorder, recurrent, moderate: Secondary | ICD-10-CM | POA: Diagnosis not present

## 2016-01-14 DIAGNOSIS — F341 Dysthymic disorder: Secondary | ICD-10-CM | POA: Diagnosis not present

## 2016-02-29 DIAGNOSIS — J0100 Acute maxillary sinusitis, unspecified: Secondary | ICD-10-CM | POA: Diagnosis not present

## 2016-03-03 DIAGNOSIS — M5116 Intervertebral disc disorders with radiculopathy, lumbar region: Secondary | ICD-10-CM | POA: Diagnosis not present

## 2016-03-03 DIAGNOSIS — M47817 Spondylosis without myelopathy or radiculopathy, lumbosacral region: Secondary | ICD-10-CM | POA: Diagnosis not present

## 2016-03-22 DIAGNOSIS — R05 Cough: Secondary | ICD-10-CM | POA: Diagnosis not present

## 2016-03-22 DIAGNOSIS — J029 Acute pharyngitis, unspecified: Secondary | ICD-10-CM | POA: Diagnosis not present

## 2016-03-22 DIAGNOSIS — J329 Chronic sinusitis, unspecified: Secondary | ICD-10-CM | POA: Diagnosis not present

## 2016-04-04 DIAGNOSIS — M546 Pain in thoracic spine: Secondary | ICD-10-CM | POA: Diagnosis not present

## 2016-04-04 DIAGNOSIS — R079 Chest pain, unspecified: Secondary | ICD-10-CM | POA: Diagnosis not present

## 2016-04-04 DIAGNOSIS — R1013 Epigastric pain: Secondary | ICD-10-CM | POA: Diagnosis not present

## 2016-04-04 DIAGNOSIS — Z9889 Other specified postprocedural states: Secondary | ICD-10-CM | POA: Diagnosis not present

## 2016-04-04 DIAGNOSIS — R1084 Generalized abdominal pain: Secondary | ICD-10-CM | POA: Diagnosis not present

## 2016-04-04 DIAGNOSIS — Z79899 Other long term (current) drug therapy: Secondary | ICD-10-CM | POA: Diagnosis not present

## 2016-04-04 DIAGNOSIS — K7689 Other specified diseases of liver: Secondary | ICD-10-CM | POA: Diagnosis not present

## 2016-04-09 DIAGNOSIS — E876 Hypokalemia: Secondary | ICD-10-CM | POA: Diagnosis not present

## 2016-04-09 DIAGNOSIS — R52 Pain, unspecified: Secondary | ICD-10-CM | POA: Diagnosis not present

## 2016-04-27 DIAGNOSIS — K7689 Other specified diseases of liver: Secondary | ICD-10-CM | POA: Diagnosis not present

## 2016-04-27 DIAGNOSIS — M549 Dorsalgia, unspecified: Secondary | ICD-10-CM | POA: Diagnosis not present

## 2016-06-10 DIAGNOSIS — Z79899 Other long term (current) drug therapy: Secondary | ICD-10-CM | POA: Diagnosis not present

## 2016-06-10 DIAGNOSIS — E559 Vitamin D deficiency, unspecified: Secondary | ICD-10-CM | POA: Diagnosis not present

## 2016-06-10 DIAGNOSIS — E785 Hyperlipidemia, unspecified: Secondary | ICD-10-CM | POA: Diagnosis not present

## 2016-06-10 DIAGNOSIS — Z0000 Encounter for general adult medical examination without abnormal findings: Secondary | ICD-10-CM | POA: Diagnosis not present

## 2016-06-15 DIAGNOSIS — M47817 Spondylosis without myelopathy or radiculopathy, lumbosacral region: Secondary | ICD-10-CM | POA: Diagnosis not present

## 2016-06-15 DIAGNOSIS — M5116 Intervertebral disc disorders with radiculopathy, lumbar region: Secondary | ICD-10-CM | POA: Diagnosis not present

## 2016-07-13 DIAGNOSIS — F331 Major depressive disorder, recurrent, moderate: Secondary | ICD-10-CM | POA: Diagnosis not present

## 2016-07-13 DIAGNOSIS — F341 Dysthymic disorder: Secondary | ICD-10-CM | POA: Diagnosis not present

## 2016-07-13 DIAGNOSIS — F900 Attention-deficit hyperactivity disorder, predominantly inattentive type: Secondary | ICD-10-CM | POA: Diagnosis not present

## 2016-07-13 DIAGNOSIS — F418 Other specified anxiety disorders: Secondary | ICD-10-CM | POA: Diagnosis not present

## 2016-07-20 DIAGNOSIS — H5213 Myopia, bilateral: Secondary | ICD-10-CM | POA: Diagnosis not present

## 2016-07-30 DIAGNOSIS — K8020 Calculus of gallbladder without cholecystitis without obstruction: Secondary | ICD-10-CM

## 2016-07-30 HISTORY — DX: Calculus of gallbladder without cholecystitis without obstruction: K80.20

## 2016-08-06 DIAGNOSIS — R0682 Tachypnea, not elsewhere classified: Secondary | ICD-10-CM | POA: Diagnosis not present

## 2016-08-06 DIAGNOSIS — M546 Pain in thoracic spine: Secondary | ICD-10-CM | POA: Diagnosis not present

## 2016-08-06 DIAGNOSIS — Z6824 Body mass index (BMI) 24.0-24.9, adult: Secondary | ICD-10-CM | POA: Diagnosis not present

## 2016-08-06 DIAGNOSIS — R0602 Shortness of breath: Secondary | ICD-10-CM | POA: Diagnosis not present

## 2016-08-06 DIAGNOSIS — K7689 Other specified diseases of liver: Secondary | ICD-10-CM | POA: Diagnosis not present

## 2016-08-14 DIAGNOSIS — R1013 Epigastric pain: Secondary | ICD-10-CM | POA: Diagnosis not present

## 2016-08-14 DIAGNOSIS — Z6823 Body mass index (BMI) 23.0-23.9, adult: Secondary | ICD-10-CM | POA: Diagnosis not present

## 2016-08-14 DIAGNOSIS — R7989 Other specified abnormal findings of blood chemistry: Secondary | ICD-10-CM | POA: Diagnosis not present

## 2016-08-14 DIAGNOSIS — F419 Anxiety disorder, unspecified: Secondary | ICD-10-CM | POA: Diagnosis not present

## 2016-08-15 ENCOUNTER — Encounter (HOSPITAL_COMMUNITY): Payer: Self-pay | Admitting: Emergency Medicine

## 2016-08-15 ENCOUNTER — Inpatient Hospital Stay (HOSPITAL_COMMUNITY)
Admission: EM | Admit: 2016-08-15 | Discharge: 2016-08-18 | DRG: 418 | Disposition: A | Discharge status: Home / Self Care | Payer: BLUE CROSS/BLUE SHIELD | Attending: Internal Medicine | Admitting: Internal Medicine

## 2016-08-15 ENCOUNTER — Emergency Department (HOSPITAL_COMMUNITY): Payer: BLUE CROSS/BLUE SHIELD

## 2016-08-15 DIAGNOSIS — K589 Irritable bowel syndrome without diarrhea: Secondary | ICD-10-CM | POA: Diagnosis present

## 2016-08-15 DIAGNOSIS — F329 Major depressive disorder, single episode, unspecified: Secondary | ICD-10-CM | POA: Diagnosis present

## 2016-08-15 DIAGNOSIS — R160 Hepatomegaly, not elsewhere classified: Secondary | ICD-10-CM | POA: Diagnosis not present

## 2016-08-15 DIAGNOSIS — Z87891 Personal history of nicotine dependence: Secondary | ICD-10-CM

## 2016-08-15 DIAGNOSIS — R945 Abnormal results of liver function studies: Secondary | ICD-10-CM | POA: Diagnosis not present

## 2016-08-15 DIAGNOSIS — R109 Unspecified abdominal pain: Secondary | ICD-10-CM | POA: Diagnosis not present

## 2016-08-15 DIAGNOSIS — D1803 Hemangioma of intra-abdominal structures: Secondary | ICD-10-CM | POA: Diagnosis not present

## 2016-08-15 DIAGNOSIS — K8064 Calculus of gallbladder and bile duct with chronic cholecystitis without obstruction: Secondary | ICD-10-CM | POA: Diagnosis present

## 2016-08-15 DIAGNOSIS — K831 Obstruction of bile duct: Secondary | ICD-10-CM | POA: Diagnosis not present

## 2016-08-15 DIAGNOSIS — Z79899 Other long term (current) drug therapy: Secondary | ICD-10-CM

## 2016-08-15 DIAGNOSIS — K851 Biliary acute pancreatitis without necrosis or infection: Secondary | ICD-10-CM | POA: Diagnosis not present

## 2016-08-15 DIAGNOSIS — E876 Hypokalemia: Secondary | ICD-10-CM | POA: Diagnosis not present

## 2016-08-15 DIAGNOSIS — K8050 Calculus of bile duct without cholangitis or cholecystitis without obstruction: Secondary | ICD-10-CM

## 2016-08-15 DIAGNOSIS — E785 Hyperlipidemia, unspecified: Secondary | ICD-10-CM | POA: Diagnosis not present

## 2016-08-15 DIAGNOSIS — K8020 Calculus of gallbladder without cholecystitis without obstruction: Secondary | ICD-10-CM | POA: Diagnosis not present

## 2016-08-15 DIAGNOSIS — R7989 Other specified abnormal findings of blood chemistry: Secondary | ICD-10-CM | POA: Diagnosis present

## 2016-08-15 DIAGNOSIS — K8010 Calculus of gallbladder with chronic cholecystitis without obstruction: Secondary | ICD-10-CM | POA: Diagnosis not present

## 2016-08-15 DIAGNOSIS — F32A Depression, unspecified: Secondary | ICD-10-CM | POA: Diagnosis present

## 2016-08-15 DIAGNOSIS — R740 Nonspecific elevation of levels of transaminase and lactic acid dehydrogenase [LDH]: Secondary | ICD-10-CM | POA: Diagnosis not present

## 2016-08-15 HISTORY — DX: Family history of other specified conditions: Z84.89

## 2016-08-15 HISTORY — DX: Calculus of gallbladder without cholecystitis without obstruction: K80.20

## 2016-08-15 LAB — URINALYSIS, ROUTINE W REFLEX MICROSCOPIC
Glucose, UA: NEGATIVE mg/dL
Hgb urine dipstick: NEGATIVE
Ketones, ur: NEGATIVE mg/dL
Leukocytes, UA: NEGATIVE
Nitrite: NEGATIVE
Protein, ur: NEGATIVE mg/dL
Specific Gravity, Urine: 1.017 (ref 1.005–1.030)
pH: 7 (ref 5.0–8.0)

## 2016-08-15 LAB — COMPREHENSIVE METABOLIC PANEL
ALT: 532 U/L — ABNORMAL HIGH (ref 14–54)
AST: 599 U/L — ABNORMAL HIGH (ref 15–41)
Albumin: 4 g/dL (ref 3.5–5.0)
Alkaline Phosphatase: 155 U/L — ABNORMAL HIGH (ref 38–126)
Anion gap: 9 (ref 5–15)
BUN: 9 mg/dL (ref 6–20)
CO2: 23 mmol/L (ref 22–32)
Calcium: 9.3 mg/dL (ref 8.9–10.3)
Chloride: 105 mmol/L (ref 101–111)
Creatinine, Ser: 0.91 mg/dL (ref 0.44–1.00)
GFR calc Af Amer: >60 mL/min (ref >60)
GFR calc non Af Amer: >60 mL/min (ref >60)
Glucose, Bld: 129 mg/dL — ABNORMAL HIGH (ref 65–99)
Potassium: 3.5 mmol/L (ref 3.5–5.1)
Sodium: 137 mmol/L (ref 135–145)
Total Bilirubin: 4.3 mg/dL — ABNORMAL HIGH (ref 0.3–1.2)
Total Protein: 6.9 g/dL (ref 6.5–8.1)

## 2016-08-15 LAB — LIPASE, BLOOD: Lipase: 55 U/L — ABNORMAL HIGH (ref 11–51)

## 2016-08-15 LAB — CBC
HCT: 39.9 % (ref 36.0–46.0)
Hemoglobin: 13.4 g/dL (ref 12.0–15.0)
MCH: 31 pg (ref 26.0–34.0)
MCHC: 33.6 g/dL (ref 30.0–36.0)
MCV: 92.4 fL (ref 78.0–100.0)
Platelets: 277 10*3/uL (ref 150–400)
RBC: 4.32 MIL/uL (ref 3.87–5.11)
RDW: 12.7 % (ref 11.5–15.5)
WBC: 5.9 10*3/uL (ref 4.0–10.5)

## 2016-08-15 LAB — POC URINE PREG, ED: Preg Test, Ur: NEGATIVE

## 2016-08-15 LAB — MAGNESIUM: Magnesium: 2.3 mg/dL (ref 1.7–2.4)

## 2016-08-15 MED ORDER — HEPARIN SODIUM (PORCINE) 5000 UNIT/ML IJ SOLN
5000.0000 [IU] | Freq: Three times a day (TID) | INTRAMUSCULAR | Status: DC
  Administered 2016-08-16: 5000 [IU] via SUBCUTANEOUS
  Filled 2016-08-15: qty 1

## 2016-08-15 MED ORDER — POTASSIUM CHLORIDE IN NACL 20-0.9 MEQ/L-% IV SOLN
INTRAVENOUS | Status: DC
  Administered 2016-08-16 – 2016-08-18 (×6): via INTRAVENOUS
  Filled 2016-08-15 (×6): qty 1000

## 2016-08-15 MED ORDER — FLUOXETINE HCL 10 MG PO CAPS
10.0000 mg | ORAL_CAPSULE | Freq: Every day | ORAL | Status: DC
  Administered 2016-08-16 – 2016-08-18 (×2): 10 mg via ORAL
  Filled 2016-08-15 (×4): qty 1

## 2016-08-15 MED ORDER — FAMOTIDINE IN NACL 20-0.9 MG/50ML-% IV SOLN
20.0000 mg | Freq: Two times a day (BID) | INTRAVENOUS | Status: DC
  Administered 2016-08-16 – 2016-08-17 (×4): 20 mg via INTRAVENOUS
  Filled 2016-08-15 (×7): qty 50

## 2016-08-15 MED ORDER — BUPROPION HCL ER (XL) 150 MG PO TB24
300.0000 mg | ORAL_TABLET | Freq: Every day | ORAL | Status: DC
  Administered 2016-08-16 – 2016-08-18 (×2): 300 mg via ORAL
  Filled 2016-08-15 (×2): qty 2

## 2016-08-15 MED ORDER — ONDANSETRON HCL 4 MG PO TABS: 4.0000 mg | ORAL_TABLET | Freq: Four times a day (QID) | ORAL | Status: DC | PRN

## 2016-08-15 MED ORDER — ONDANSETRON HCL 4 MG/2ML IJ SOLN: 4.0000 mg | Freq: Four times a day (QID) | INTRAMUSCULAR | Status: DC | PRN

## 2016-08-15 MED ORDER — HYDROMORPHONE HCL 1 MG/ML IJ SOLN
1.0000 mg | INTRAMUSCULAR | Status: DC | PRN
  Filled 2016-08-15: qty 1

## 2016-08-15 MED ORDER — LAMOTRIGINE 25 MG PO TABS
100.0000 mg | ORAL_TABLET | Freq: Every day | ORAL | Status: DC
  Administered 2016-08-16 – 2016-08-18 (×2): 100 mg via ORAL
  Filled 2016-08-15 (×2): qty 4

## 2016-08-15 NOTE — ED Notes (Signed)
Pt in u/s at this time

## 2016-08-15 NOTE — ED Triage Notes (Addendum)
Pt seen at [facility] ED last night for upper abd pain that was believed to be due to gallbladder.  States they didn't have [place] capability last night.  C/o sharp pain across thoracic back x 1 hour with sob.  Denies nausea, vomiting, and urinary complaints.

## 2016-08-15 NOTE — ED Provider Notes (Signed)
[place] DEPT Provider Note   CSN: [id number] Arrival date & time: 08/15/16  1908     History   Chief Complaint Chief Complaint  Patient presents with  . Back Pain    upper back  . Shortness of Breath    HPI VERLIE LIOTTA is a 51 y.o. female.  HPI  Patient is a 51 year old female who presents to the emergency department with intermittent abdominal pain. Over the past 9 days has had 3 episodes of back pain that wraps around to her mid abdomen. Associated with some mild nausea and diaphoresis. Last for approximately 4-5 hours and then improves with pain medication. Does not occur after eating, episodes have occurred while she is asleep. Denies fever, chills, emesis, diarrhea, shortness of breath. Denies alcohol use. Denies prior history of gastritis/peptic ulcer disease. No chronic NSAID use. States that she went to the emergency department last night for similar symptoms, they were unable to obtain an ultrasound at that time. She was given pain medication and then discharged home for an outpatient ultrasound. Patient was told she had mildly elevated LFTs and lipase. Returns today due to recurrence of symptoms. Symptoms resolved while in the waiting room, currently without abdominal pain.  Past Medical History:  Diagnosis Date  . Depression   . Endometriosis   . Hyperlipidemia   . IBS (irritable bowel syndrome)     Patient Active Problem List   Diagnosis Date Noted  . Acute gallstone pancreatitis 08/15/2016  . Hyperlipidemia 08/15/2016  . Depression 08/15/2016  . Abnormal LFTs 08/15/2016  . Liver mass 08/15/2016    Past Surgical History:  Procedure Laterality Date  . BACK SURGERY    . ECTOPIC PREGNANCY SURGERY    . LAPAROSCOPY    . SHOULDER SURGERY Bilateral   . TUBAL LIGATION      OB History    No data available       Home Medications    Prior to Admission medications   Medication Sig Start Date End Date Taking? Authorizing Provider  buPROPion  (WELLBUTRIN XL) 300 MG 24 hr tablet Take 300 mg by mouth daily.   Yes [provider]  FLUoxetine (PROZAC) 10 MG capsule Take 10 mg by mouth daily.   Yes [provider]  lamoTRIgine (LAMICTAL) 100 MG tablet Take 100 mg by mouth daily.   Yes [provider]  traMADol (ULTRAM) 50 MG tablet Take 50 mg by mouth every 6 (six) hours as needed (pain).  07/27/16  Yes [provider]  oxyCODONE-acetaminophen (PERCOCET/ROXICET) 5-325 MG per tablet Take 1-2 tablets by mouth every 6 (six) hours as needed for severe pain. Patient not taking: Reported on 08/15/2016 07/06/14   Carlisle Cater, PA-C    Family History Family History  Problem Relation Age of Onset  . Prostate cancer Father        Living, 40  . Bladder Cancer Mother        Living, 55  . Hypertension Mother   . Aneurysm Mother        brain  . Colon cancer Maternal Grandfather   . Healthy Brother        x 2  . Healthy Daughter     Social History Social History  Substance Use Topics  . Smoking status: Former Research scientist (life sciences)  . Smokeless tobacco: Never Used     Comment: Quit >24 yrs ago  . Alcohol use No     Allergies   Patient has no known allergies.   Review of  Systems Review of Systems  Constitutional: Negative for appetite change and fever.  HENT: Negative for trouble swallowing.   Respiratory: Negative for cough, chest tightness and shortness of breath.   Cardiovascular: Negative for chest pain.  Gastrointestinal: Positive for abdominal pain and nausea. Negative for blood in stool, diarrhea and vomiting.  Genitourinary: Negative for dysuria, flank pain, hematuria and vaginal discharge.  Musculoskeletal: Positive for back pain.  Skin: Negative for rash.  Allergic/Immunologic: Negative for immunocompromised state.  Neurological: Negative for dizziness and weakness.  Psychiatric/Behavioral: Negative for behavioral problems.     Physical Exam Updated Vital Signs BP 115/61   Pulse 76   Temp  98.5 F (36.9 C) (Oral)   Resp 13   SpO2 96%   Physical Exam  Constitutional: She is oriented to person, place, and time. She appears well-developed and well-nourished. No distress.  HENT:  Head: Atraumatic.  Mouth/Throat: Oropharynx is clear and moist.  Eyes: Conjunctivae and EOM are normal.  Neck: Normal range of motion. Neck supple.  Cardiovascular: Normal rate, regular rhythm, normal heart sounds and intact distal pulses.   Pulmonary/Chest: Effort normal and breath sounds normal. No respiratory distress.  Abdominal: She exhibits no distension and no mass. There is tenderness ( Mild right upper quadrant tenderness to palpation). There is no guarding.  Negative Murphy's sign, negative McBurney's point. No CVA tenderness.  Musculoskeletal: Normal range of motion. She exhibits no edema.  Neurological: She is alert and oriented to person, place, and time.  Skin: Skin is warm. No pallor.  Psychiatric: She has a normal mood and affect.     ED Treatments / Results  Labs (all labs ordered are listed, but only abnormal results are displayed) Labs Reviewed  LIPASE, BLOOD - Abnormal; Notable for the following:       Result Value   Lipase 55 (*)    All other components within normal limits  COMPREHENSIVE METABOLIC PANEL - Abnormal; Notable for the following:    Glucose, Bld 129 (*)    AST 599 (*)    ALT 532 (*)    Alkaline Phosphatase 155 (*)    Total Bilirubin 4.3 (*)    All other components within normal limits  URINALYSIS, ROUTINE W REFLEX MICROSCOPIC - Abnormal; Notable for the following:    Color, Urine AMBER (*)    APPearance CLOUDY (*)    Bilirubin Urine SMALL (*)    All other components within normal limits  CBC  MAGNESIUM  POC URINE PREG, ED  I-STAT TROPOININ, ED    EKG  EKG Interpretation  Date/Time:  Sunday August 15 2016 19:16:47 EDT Ventricular Rate:  79 PR Interval:  110 QRS Duration: 86 QT Interval:  396 QTC Calculation: 454 R Axis:   75 Text  Interpretation:  Sinus rhythm with short PR Otherwise normal ECG Confirmed by Ashok Cordia  MD, Lennette Bihari ([id number]) on 08/15/2016 8:57:01 PM       Radiology US Abdomen Limited  Result Date: 08/15/2016 CLINICAL DATA:  Right upper quadrant pain. EXAM: ULTRASOUND ABDOMEN LIMITED RIGHT UPPER QUADRANT COMPARISON:  None. FINDINGS: Gallbladder: The gallbladder is normally dilated. No evidence of gallbladder wall thickening. Gallbladder wall measures maximum diameter of 2 mm. No sonographic Murphy's sign. Numerous small layering mobile gallstones, the largest measuring 4 mm. Common bile duct: Diameter: 7 mm Liver: There is a circumscribed isoechoic to liver parenchyma subcapsular mass in the left lobe of the liver measuring 2.4 x 1.9 x 2.2 cm. Simple appearing right lobe cyst measures 1.6 cm. Normal direction  of flow of the main portal vein. IMPRESSION: Numerous gallstones without sonographic evidence of acute cholecystitis. Borderline dilation of the extrahepatic common bile duct. Please correlate clinically. Solid-appearing 2.4 cm left hepatic subcapsular mass. Further evaluation with MRI of the abdomen, liver protocol may be considered. Electronically Signed   By: Fidela Salisbury M.D.   On: 08/15/2016 22:09    Procedures Procedures (including critical care time)  Medications Ordered in ED Medications - No data to display   Initial Impression / Assessment and Plan / ED Course  I have reviewed the triage vital signs and the nursing notes.  Pertinent labs & imaging results that were available during my care of the patient were reviewed by me and considered in my medical decision making (see chart for details).    Patient is a 51 year old female with past medical history significant for prior appendectomy, who presents to the emergency department with episodes of abdominal pain. Prior ultrasound couple of years ago showed gallstones without cholecystitis.  Lab work remarkable for no leukocytosis. Lipase 55.  AST 600, ALT 532, alkaline phosphatase 155, T bili 4.3. Yesterday AST 100s, ALT 100s. UA showed no signs of infection. Pregnancy negative. Clinical picture concerning for choledocholithiasis, less likely cholangitis or acute cholecystitis - will obtain RUQ [place] to further evaluate.   Ultrasound showed gallstones, common bile duct 7 mm. No signs of cholecystitis. Patient admitted to hospitalist, Dr. Olevia Bowens, for further management of choledocholithiasis.  Final Clinical Impressions(s) / ED Diagnoses   Final diagnoses:  Choledocholithiasis    New Prescriptions New Prescriptions   No medications on file     Nathaniel Man, MD 08/15/16 2329    Lajean Saver, MD 08/16/16 [phone]

## 2016-08-15 NOTE — H&P (Signed)
History and Physical    Victoria Weiss:[id number] DOB: [birth year] DOA: 08/15/2016  PCP: Jonathon Jordan, MD   Patient coming from: Home.  I have personally briefly reviewed patient's old medical records in [place]  Chief Complaint: Abdominal pain.  HPI: Victoria Weiss is a 51 y.o. female with medical history significant of depression, endometriosis, hyperlipidemia, IBS who is coming to the emergency department with complaints of abdominal pain since yesterday evening 2100 after she ate lasagna for dinner. She was seeing at the [facility] ED Last Night, treated for pain, but workup was not done due to lack of ultrasound capability last night another facility. She describes the pain as being burning like, deep in her back, radiating around her back and abdomen. She denies fever, chills, nausea, emesis, diarrhea, constipation, melena or hematochezia. She denies dysuria, frequency or gross hematuria. She denies sore throat, cough, chest pain, dyspnea, palpitations, diaphoresis, pitting edema of the lower extremities, PND or orthopnea.  ED Course: The patient vital signs were 98.29F, pulse 74, respirations 24 blood pressure 107/87 mmHg and O2 sat 100% on room air. She was given IV fluids in the emergency department. Workup shows a normal CBC, normal electrolytes, but her LFTs show elevation with AST of 599, ALT 532 and alkaline phosphatase are 155 units. Her total bilirubin was 4.3 mg/dL.   Imaging: An ultrasound of the right upper quadrant is showing multiple gallstones without sonographic evidence of cholecystitis. There was also a solid appearing mass 2.4 x 1.9 x 2.0 cm in the left lobe of the liver. MRI of the abdomen was suggested.  Review of Systems: As per HPI otherwise 10 point review of systems negative.    Past Medical History:  Diagnosis Date  . Depression   . Endometriosis   . Hyperlipidemia   . IBS (irritable bowel syndrome)     Past Surgical History:  Procedure  Laterality Date  . BACK SURGERY    . ECTOPIC PREGNANCY SURGERY    . LAPAROSCOPY    . SHOULDER SURGERY Bilateral   . TUBAL LIGATION       reports that she has quit smoking. She has never used smokeless tobacco. She reports that she does not drink alcohol or use drugs.  No Known Allergies  Family History  Problem Relation Age of Onset  . Prostate cancer Father        Living, 55  . Bladder Cancer Mother        Living, 60  . Hypertension Mother   . Aneurysm Mother        brain  . Colon cancer Maternal Grandfather   . Healthy Brother        x 2  . Healthy Daughter     Prior to Admission medications   Medication Sig Start Date End Date Taking? Authorizing Provider  buPROPion (WELLBUTRIN XL) 300 MG 24 hr tablet Take 300 mg by mouth daily.   Yes [provider]  FLUoxetine (PROZAC) 10 MG capsule Take 10 mg by mouth daily.   Yes [provider]  lamoTRIgine (LAMICTAL) 100 MG tablet Take 100 mg by mouth daily.   Yes [provider]  traMADol (ULTRAM) 50 MG tablet Take 50 mg by mouth every 6 (six) hours as needed (pain).  07/27/16  Yes [provider]  oxyCODONE-acetaminophen (PERCOCET/ROXICET) 5-325 MG per tablet Take 1-2 tablets by mouth every 6 (six) hours as needed for severe pain. Patient not taking: Reported on 08/15/2016 07/06/14   Carlisle Cater,  PA-C    Physical Exam: Vitals:   08/15/16 1930 08/15/16 2102 08/15/16 2140  BP: 107/83 119/67 117/72  Pulse: 74 76   Resp: (!) 24 16 11   Temp: 98.5 F (36.9 C)    TempSrc: Oral    SpO2: 100% 96%     Constitutional: NAD, calm, comfortable Eyes: PERRL, Positive scleral icterus. ENMT: Mucous membranes are moist. Posterior pharynx clear of any exudate or lesions. Neck: normal, supple, no masses, no thyromegaly Respiratory: clear to auscultation bilaterally, no wheezing, no crackles. Normal respiratory effort. No accessory muscle use.  Cardiovascular: Regular rate and rhythm, no murmurs / rubs /  gallops. No extremity edema. 2+ pedal pulses. No carotid bruits.  Abdomen: Nondistended, soft, positive epigastric tenderness, no masses palpated. No hepatosplenomegaly. Bowel sounds positive.  Musculoskeletal: no clubbing / cyanosis.  Good ROM, no contractures. Normal muscle tone.  Skin: no significant rashes, lesions, ulcers on limited skin exam. Neurologic: CN 2-12 grossly intact. Sensation intact, DTR normal. Strength 5/5 in all 4.  Psychiatric: Normal judgment and insight. Alert and oriented x 3. Normal mood.    Labs on Admission: I have personally reviewed following labs and imaging studies  CBC:  Recent Labs Lab 08/15/16 1921  WBC 5.9  HGB 13.4  HCT 39.9  MCV 92.4  PLT 016   Basic Metabolic Panel:  Recent Labs Lab 08/15/16 1921  NA 137  K 3.5  CL 105  CO2 23  GLUCOSE 129*  BUN 9  CREATININE 0.91  CALCIUM 9.3   GFR: CrCl cannot be calculated (Unknown ideal weight.). Liver Function Tests:  Recent Labs Lab 08/15/16 1921  AST 599*  ALT 532*  ALKPHOS 155*  BILITOT 4.3*  PROT 6.9  ALBUMIN 4.0    Recent Labs Lab 08/15/16 1921  LIPASE 55*   No results for input(s): AMMONIA in the last 168 hours. Coagulation Profile: No results for input(s): INR, PROTIME in the last 168 hours. Cardiac Enzymes: No results for input(s): CKTOTAL, CKMB, CKMBINDEX, TROPONINI in the last 168 hours. BNP (last 3 results) No results for input(s): PROBNP in the last 8760 hours. HbA1C: No results for input(s): HGBA1C in the last 72 hours. CBG: No results for input(s): GLUCAP in the last 168 hours. Lipid Profile: No results for input(s): CHOL, HDL, LDLCALC, TRIG, CHOLHDL, LDLDIRECT in the last 72 hours. Thyroid Function Tests: No results for input(s): TSH, T4TOTAL, FREET4, T3FREE, THYROIDAB in the last 72 hours. Anemia Panel: No results for input(s): VITAMINB12, FOLATE, FERRITIN, TIBC, IRON, RETICCTPCT in the last 72 hours. Urine analysis:    Component Value Date/Time    COLORURINE AMBER (A) 08/15/2016 1929   APPEARANCEUR CLOUDY (A) 08/15/2016 1929   LABSPEC 1.017 08/15/2016 1929   PHURINE 7.0 08/15/2016 1929   GLUCOSEU NEGATIVE 08/15/2016 1929   HGBUR NEGATIVE 08/15/2016 1929   BILIRUBINUR SMALL (A) 08/15/2016 1929   KETONESUR NEGATIVE 08/15/2016 1929   PROTEINUR NEGATIVE 08/15/2016 1929   UROBILINOGEN 0.2 07/06/2014 0704   NITRITE NEGATIVE 08/15/2016 1929   LEUKOCYTESUR NEGATIVE 08/15/2016 1929    Radiological Exams on Admission: US Abdomen Limited  Result Date: 08/15/2016 CLINICAL DATA:  Right upper quadrant pain. EXAM: ULTRASOUND ABDOMEN LIMITED RIGHT UPPER QUADRANT COMPARISON:  None. FINDINGS: Gallbladder: The gallbladder is normally dilated. No evidence of gallbladder wall thickening. Gallbladder wall measures maximum diameter of 2 mm. No sonographic Murphy's sign. Numerous small layering mobile gallstones, the largest measuring 4 mm. Common bile duct: Diameter: 7 mm Liver: There is a circumscribed isoechoic to liver parenchyma subcapsular  mass in the left lobe of the liver measuring 2.4 x 1.9 x 2.2 cm. Simple appearing right lobe cyst measures 1.6 cm. Normal direction of flow of the main portal vein. IMPRESSION: Numerous gallstones without sonographic evidence of acute cholecystitis. Borderline dilation of the extrahepatic common bile duct. Please correlate clinically. Solid-appearing 2.4 cm left hepatic subcapsular mass. Further evaluation with MRI of the abdomen, liver protocol may be considered. Electronically Signed   By: Fidela Salisbury M.D.   On: 08/15/2016 22:09    EKG: Independently reviewed. Vent. rate 79 BPM PR interval 110 ms QRS duration 86 ms QT/QTc 396/454 ms P-R-T axes 73 75 52 Sinus rhythm with short PR Otherwise normal ECG  Assessment/Plan Principal Problem:   Acute gallstone pancreatitis Admit to MedSurg/inpatient. Continue IV fluids. Continue analgesics as needed. Antiemetics as needed. Famotidine 20 mg IVP every 12  hours. Follow-up CBC, CMP and lipase in a.m. Check MRI/MRCP in a.m. Consult GI and general surgery in a.m.  Active Problems:   Abnormal LFTs Secondary to above. Check LFTs in a.m.  including direct bilirubin Check MRI/MRCP of the abdomen tomorrow. Will consult GI in a.m.    Hyperlipidemia Check fasting lipid panel in the presence of gallstone pancreatitis.    Depression Continue fluoxetine 10 mg by mouth daily. Continue Wellbutrin XL 300 mg by mouth daily.      Liver mass Per patient, this has been present and followed regularly for the past 5 years.  Check MRI/MRCP of the liver in a.m.     DVT prophylaxis: Heparin SQ. Code Status: Full code. Family Communication:  Disposition Plan: Admit to [facility] for symptoms management and further workup. Consults called:  Admission status: Inpatient/MedSurg.   Reubin Milan MD [facility] Pager [phone].  If 7PM-7AM, please contact night-coverage [email or web address] Password [facility]  08/15/2016, 10:52 PM

## 2016-08-16 ENCOUNTER — Encounter (HOSPITAL_COMMUNITY): Payer: Self-pay | Admitting: Internal Medicine

## 2016-08-16 ENCOUNTER — Inpatient Hospital Stay (HOSPITAL_COMMUNITY): Payer: BLUE CROSS/BLUE SHIELD

## 2016-08-16 DIAGNOSIS — E785 Hyperlipidemia, unspecified: Secondary | ICD-10-CM

## 2016-08-16 DIAGNOSIS — K851 Biliary acute pancreatitis without necrosis or infection: Principal | ICD-10-CM

## 2016-08-16 DIAGNOSIS — K8050 Calculus of bile duct without cholangitis or cholecystitis without obstruction: Secondary | ICD-10-CM

## 2016-08-16 DIAGNOSIS — R160 Hepatomegaly, not elsewhere classified: Secondary | ICD-10-CM

## 2016-08-16 LAB — LIPID PANEL
Cholesterol: 220 mg/dL — ABNORMAL HIGH (ref 0–200)
HDL: 79 mg/dL (ref >40)
LDL Cholesterol: 133 mg/dL — ABNORMAL HIGH (ref 0–99)
Total CHOL/HDL Ratio: 2.8 RATIO
Triglycerides: 39 mg/dL (ref <150)
VLDL: 8 mg/dL (ref 0–40)

## 2016-08-16 LAB — COMPREHENSIVE METABOLIC PANEL
ALT: 609 U/L — ABNORMAL HIGH (ref 14–54)
AST: 590 U/L — ABNORMAL HIGH (ref 15–41)
Albumin: 3.5 g/dL (ref 3.5–5.0)
Alkaline Phosphatase: 168 U/L — ABNORMAL HIGH (ref 38–126)
Anion gap: 6 (ref 5–15)
BUN: 7 mg/dL (ref 6–20)
CO2: 28 mmol/L (ref 22–32)
Calcium: 8.7 mg/dL — ABNORMAL LOW (ref 8.9–10.3)
Chloride: 108 mmol/L (ref 101–111)
Creatinine, Ser: 0.82 mg/dL (ref 0.44–1.00)
GFR calc Af Amer: >60 mL/min (ref >60)
GFR calc non Af Amer: >60 mL/min (ref >60)
Glucose, Bld: 94 mg/dL (ref 65–99)
Potassium: 4.1 mmol/L (ref 3.5–5.1)
Sodium: 142 mmol/L (ref 135–145)
Total Bilirubin: 4.6 mg/dL — ABNORMAL HIGH (ref 0.3–1.2)
Total Protein: 6 g/dL — ABNORMAL LOW (ref 6.5–8.1)

## 2016-08-16 LAB — GLUCOSE, CAPILLARY
Glucose-Capillary: 75 mg/dL (ref 65–99)
Glucose-Capillary: 77 mg/dL (ref 65–99)
Glucose-Capillary: 82 mg/dL (ref 65–99)
Glucose-Capillary: 92 mg/dL (ref 65–99)

## 2016-08-16 LAB — HIV ANTIBODY (ROUTINE TESTING W REFLEX): HIV Screen 4th Generation wRfx: NONREACTIVE

## 2016-08-16 LAB — CBC
HCT: 37.7 % (ref 36.0–46.0)
Hemoglobin: 12.2 g/dL (ref 12.0–15.0)
MCH: 30.4 pg (ref 26.0–34.0)
MCHC: 32.4 g/dL (ref 30.0–36.0)
MCV: 94 fL (ref 78.0–100.0)
Platelets: 214 10*3/uL (ref 150–400)
RBC: 4.01 MIL/uL (ref 3.87–5.11)
RDW: 13 % (ref 11.5–15.5)
WBC: 5.2 10*3/uL (ref 4.0–10.5)

## 2016-08-16 LAB — LIPASE, BLOOD: Lipase: 41 U/L (ref 11–51)

## 2016-08-16 LAB — BILIRUBIN, DIRECT: Bilirubin, Direct: 2.7 mg/dL — ABNORMAL HIGH (ref 0.1–0.5)

## 2016-08-16 MED ORDER — GADOBENATE DIMEGLUMINE 529 MG/ML IV SOLN
15.0000 mL | Freq: Once | INTRAVENOUS | Status: AC | PRN
  Administered 2016-08-16: 15 mL via INTRAVENOUS

## 2016-08-16 MED ORDER — SODIUM CHLORIDE 0.9 % IV SOLN: INTRAVENOUS | Status: DC

## 2016-08-16 MED ORDER — CIPROFLOXACIN IN D5W 400 MG/200ML IV SOLN
400.0000 mg | INTRAVENOUS | Status: AC
  Administered 2016-08-17: 400 mg via INTRAVENOUS
  Filled 2016-08-16: qty 200

## 2016-08-16 NOTE — Progress Notes (Signed)
Patient arrived to floor. Alert and oriented x4. Family at bedside. Report received from Autumn, Therapist, sports.

## 2016-08-16 NOTE — Consult Note (Signed)
Reason for Consult: Abnormal MRI Referring Physician: Hospital team  Victoria Weiss is an 51 y.o. female.  HPI: Patient seen and examined and her hospital computer chart reviewed and her office computer chart reviewed and her case discussed with her husband as well and she's been to the ER multiple times lately with symptomatic gallstones and her pain increased she was referred here and an MRI was done and reviewed pertinent for CBD stones and I was consulted for further workup and plans and her case was discussed with the surgical team as well and we answered all of her and her husband's questions  Past Medical History:  Diagnosis Date  . Depression   . Endometriosis   . Hyperlipidemia   . IBS (irritable bowel syndrome)     Past Surgical History:  Procedure Laterality Date  . BACK SURGERY    . ECTOPIC PREGNANCY SURGERY    . LAPAROSCOPY    . SHOULDER SURGERY Bilateral   . TUBAL LIGATION      Family History  Problem Relation Age of Onset  . Prostate cancer Father        Living, 62  . Bladder Cancer Mother        Living, 20  . Hypertension Mother   . Aneurysm Mother        brain  . Colon cancer Maternal Grandfather   . Healthy Brother        x 2  . Healthy Daughter     Social History:  reports that she has quit smoking. She has never used smokeless tobacco. She reports that she does not drink alcohol or use drugs.  Allergies: No Known Allergies  Medications: I have reviewed the patient's current medications.  Results for orders placed or performed during the hospital encounter of 08/15/16 (from the past 48 hour(s))  Lipase, blood     Status: Abnormal   Collection Time: 08/15/16  7:21 PM  Result Value Ref Range   Lipase 55 (H) 11 - 51 U/L  Comprehensive metabolic panel     Status: Abnormal   Collection Time: 08/15/16  7:21 PM  Result Value Ref Range   Sodium 137 135 - 145 mmol/L   Potassium 3.5 3.5 - 5.1 mmol/L   Chloride 105 101 - 111 mmol/L   CO2 23 22 - 32 mmol/L    Glucose, Bld 129 (H) 65 - 99 mg/dL   BUN 9 6 - 20 mg/dL   Creatinine, Ser 0.91 0.44 - 1.00 mg/dL   Calcium 9.3 8.9 - 10.3 mg/dL   Total Protein 6.9 6.5 - 8.1 g/dL   Albumin 4.0 3.5 - 5.0 g/dL   AST 599 (H) 15 - 41 U/L   ALT 532 (H) 14 - 54 U/L   Alkaline Phosphatase 155 (H) 38 - 126 U/L   Total Bilirubin 4.3 (H) 0.3 - 1.2 mg/dL   GFR calc non Af Amer >60 >60 mL/min   GFR calc Af Amer >60 >60 mL/min    Comment: (NOTE) The eGFR has been calculated using the CKD EPI equation. This calculation has not been validated in all clinical situations. eGFR's persistently <60 mL/min signify possible Chronic Kidney Disease.    Anion gap 9 5 - 15  CBC     Status: None   Collection Time: 08/15/16  7:21 PM  Result Value Ref Range   WBC 5.9 4.0 - 10.5 K/uL   RBC 4.32 3.87 - 5.11 MIL/uL   Hemoglobin 13.4 12.0 - 15.0 g/dL  HCT 39.9 36.0 - 46.0 %   MCV 92.4 78.0 - 100.0 fL   MCH 31.0 26.0 - 34.0 pg   MCHC 33.6 30.0 - 36.0 g/dL   RDW 12.7 11.5 - 15.5 %   Platelets 277 150 - 400 K/uL  Urinalysis, Routine w reflex microscopic     Status: Abnormal   Collection Time: 08/15/16  7:29 PM  Result Value Ref Range   Color, Urine AMBER (A) YELLOW    Comment: BIOCHEMICALS MAY BE AFFECTED BY COLOR   APPearance CLOUDY (A) CLEAR   Specific Gravity, Urine 1.017 1.005 - 1.030   pH 7.0 5.0 - 8.0   Glucose, UA NEGATIVE NEGATIVE mg/dL   Hgb urine dipstick NEGATIVE NEGATIVE   Bilirubin Urine SMALL (A) NEGATIVE   Ketones, ur NEGATIVE NEGATIVE mg/dL   Protein, ur NEGATIVE NEGATIVE mg/dL   Nitrite NEGATIVE NEGATIVE   Leukocytes, UA NEGATIVE NEGATIVE  POC urine preg, ED     Status: None   Collection Time: 08/15/16  7:56 PM  Result Value Ref Range   Preg Test, Ur NEGATIVE NEGATIVE    Comment:        THE SENSITIVITY OF THIS METHODOLOGY IS >24 mIU/mL   Magnesium     Status: None   Collection Time: 08/15/16 11:03 PM  Result Value Ref Range   Magnesium 2.3 1.7 - 2.4 mg/dL  Glucose, capillary     Status:  None   Collection Time: 08/16/16 12:25 AM  Result Value Ref Range   Glucose-Capillary 92 65 - 99 mg/dL  Comprehensive metabolic panel     Status: Abnormal   Collection Time: 08/16/16  3:55 AM  Result Value Ref Range   Sodium 142 135 - 145 mmol/L   Potassium 4.1 3.5 - 5.1 mmol/L   Chloride 108 101 - 111 mmol/L   CO2 28 22 - 32 mmol/L   Glucose, Bld 94 65 - 99 mg/dL   BUN 7 6 - 20 mg/dL   Creatinine, Ser 0.82 0.44 - 1.00 mg/dL   Calcium 8.7 (L) 8.9 - 10.3 mg/dL   Total Protein 6.0 (L) 6.5 - 8.1 g/dL   Albumin 3.5 3.5 - 5.0 g/dL   AST 590 (H) 15 - 41 U/L   ALT 609 (H) 14 - 54 U/L   Alkaline Phosphatase 168 (H) 38 - 126 U/L   Total Bilirubin 4.6 (H) 0.3 - 1.2 mg/dL   GFR calc non Af Amer >60 >60 mL/min   GFR calc Af Amer >60 >60 mL/min    Comment: (NOTE) The eGFR has been calculated using the CKD EPI equation. This calculation has not been validated in all clinical situations. eGFR's persistently <60 mL/min signify possible Chronic Kidney Disease.    Anion gap 6 5 - 15  CBC     Status: None   Collection Time: 08/16/16  3:55 AM  Result Value Ref Range   WBC 5.2 4.0 - 10.5 K/uL   RBC 4.01 3.87 - 5.11 MIL/uL   Hemoglobin 12.2 12.0 - 15.0 g/dL   HCT 37.7 36.0 - 46.0 %   MCV 94.0 78.0 - 100.0 fL   MCH 30.4 26.0 - 34.0 pg   MCHC 32.4 30.0 - 36.0 g/dL   RDW 13.0 11.5 - 15.5 %   Platelets 214 150 - 400 K/uL  Lipase, blood     Status: None   Collection Time: 08/16/16  3:55 AM  Result Value Ref Range   Lipase 41 11 - 51 U/L  Lipid panel  Status: Abnormal   Collection Time: 08/16/16  3:55 AM  Result Value Ref Range   Cholesterol 220 (H) 0 - 200 mg/dL   Triglycerides 39 <150 mg/dL   HDL 79 >40 mg/dL   Total CHOL/HDL Ratio 2.8 RATIO   VLDL 8 0 - 40 mg/dL   LDL Cholesterol 133 (H) 0 - 99 mg/dL    Comment:        Total Cholesterol/HDL:CHD Risk Coronary Heart Disease Risk Table                     Men   Women  1/2 Average Risk   3.4   3.3  Average Risk       5.0   4.4  2 X  Average Risk   9.6   7.1  3 X Average Risk  23.4   11.0        Use the calculated Patient Ratio above and the CHD Risk Table to determine the patient's CHD Risk.        ATP III CLASSIFICATION (LDL):  <100     mg/dL   Optimal  100-129  mg/dL   Near or Above                    Optimal  130-159  mg/dL   Borderline  160-189  mg/dL   High  >190     mg/dL   Very High   Bilirubin, direct     Status: Abnormal   Collection Time: 08/16/16  3:55 AM  Result Value Ref Range   Bilirubin, Direct 2.7 (H) 0.1 - 0.5 mg/dL  Glucose, capillary     Status: None   Collection Time: 08/16/16  5:17 AM  Result Value Ref Range   Glucose-Capillary 82 65 - 99 mg/dL    Mr 3d Recon At Scanner  Result Date: 08/16/2016 CLINICAL DATA:  Intermittent abdominal pain for the past 10 days. Back and abdominal pain and numerous gallstones on ultrasound. EXAM: MRI ABDOMEN WITHOUT AND WITH CONTRAST (INCLUDING MRCP) TECHNIQUE: Multiplanar multisequence MR imaging of the abdomen was performed both before and after the administration of intravenous contrast. Heavily T2-weighted images of the biliary and pancreatic ducts were obtained, and three-dimensional MRCP images were rendered by post processing. CONTRAST:  96m MULTIHANCE GADOBENATE DIMEGLUMINE 529 MG/ML IV SOLN COMPARISON:  Ultrasound 08/15/2016 FINDINGS: Lower chest: The lung base is are clear. No worrisome pulmonary lesions, pleural or pericardial effusion. Hepatobiliary: There are small scattered hepatic cysts. The largest cyst is in segment 6 near the right kidney and measures 15 mm. There is also a 3.5 cm lesion in segment 3. This has typical MR imaging features of a benign hepatic hemangioma. Peripheral nodular enhancement with complete filling and on delayed images and enhancement similar to the blood pool on delayed images. No worrisome hepatic lesions. Myelo old intrahepatic biliary dilatation and mild common bile duct dilatation with maximum measurement of 7 mm.  Findings suspicious for a tiny distal common bile duct stone measuring approximately 2.5 mm. This is best seen on coronal sequences, series 3, image 11. It is more difficult to see on the dedicated MRCP images but there does appear to be a reversed meniscus sign. No findings for acute cholecystitis. There are innumerable small gallstones layering dependently in the gallbladder. Pancreas:  No mass, inflammation or ductal dilatation. Spleen:  Normal size.  No focal lesions. Adrenals/Urinary Tract: The adrenal glands and kidneys are unremarkable. Stomach/Bowel: Visualized portions within the abdomen are  unremarkable. Vascular/Lymphatic: No pathologically enlarged lymph nodes identified. No abdominal aortic aneurysm demonstrated. Other: No ascites or abdominal wall hernia. Small thoracic disc protrusion noted on the left at T11-12. Musculoskeletal: No significant bony findings. IMPRESSION: 1. Cholelithiasis with numerous small layering gallstones in the gallbladder but no findings for acute cholecystitis. 2. Mild common bile duct dilatation measuring a maximum of 7 mm. Suspect a small, 2.5 mm distal common bile duct stone. 3. Small hepatic cysts and a 3.5 cm left hepatic lobe hemangioma. 4. No other significant abdominal findings. Electronically Signed   By: Marijo Sanes M.D.   On: 08/16/2016 08:45   US Abdomen Limited  Result Date: 08/15/2016 CLINICAL DATA:  Right upper quadrant pain. EXAM: ULTRASOUND ABDOMEN LIMITED RIGHT UPPER QUADRANT COMPARISON:  None. FINDINGS: Gallbladder: The gallbladder is normally dilated. No evidence of gallbladder wall thickening. Gallbladder wall measures maximum diameter of 2 mm. No sonographic Murphy's sign. Numerous small layering mobile gallstones, the largest measuring 4 mm. Common bile duct: Diameter: 7 mm Liver: There is a circumscribed isoechoic to liver parenchyma subcapsular mass in the left lobe of the liver measuring 2.4 x 1.9 x 2.2 cm. Simple appearing right lobe cyst  measures 1.6 cm. Normal direction of flow of the main portal vein. IMPRESSION: Numerous gallstones without sonographic evidence of acute cholecystitis. Borderline dilation of the extrahepatic common bile duct. Please correlate clinically. Solid-appearing 2.4 cm left hepatic subcapsular mass. Further evaluation with MRI of the abdomen, liver protocol may be considered. Electronically Signed   By: Fidela Salisbury M.D.   On: 08/15/2016 22:09   Mr Abdomen Mrcp Moise Boring Contast  Result Date: 08/16/2016 CLINICAL DATA:  Intermittent abdominal pain for the past 10 days. Back and abdominal pain and numerous gallstones on ultrasound. EXAM: MRI ABDOMEN WITHOUT AND WITH CONTRAST (INCLUDING MRCP) TECHNIQUE: Multiplanar multisequence MR imaging of the abdomen was performed both before and after the administration of intravenous contrast. Heavily T2-weighted images of the biliary and pancreatic ducts were obtained, and three-dimensional MRCP images were rendered by post processing. CONTRAST:  71m MULTIHANCE GADOBENATE DIMEGLUMINE 529 MG/ML IV SOLN COMPARISON:  Ultrasound 08/15/2016 FINDINGS: Lower chest: The lung base is are clear. No worrisome pulmonary lesions, pleural or pericardial effusion. Hepatobiliary: There are small scattered hepatic cysts. The largest cyst is in segment 6 near the right kidney and measures 15 mm. There is also a 3.5 cm lesion in segment 3. This has typical MR imaging features of a benign hepatic hemangioma. Peripheral nodular enhancement with complete filling and on delayed images and enhancement similar to the blood pool on delayed images. No worrisome hepatic lesions. Myelo old intrahepatic biliary dilatation and mild common bile duct dilatation with maximum measurement of 7 mm. Findings suspicious for a tiny distal common bile duct stone measuring approximately 2.5 mm. This is best seen on coronal sequences, series 3, image 11. It is more difficult to see on the dedicated MRCP images but there  does appear to be a reversed meniscus sign. No findings for acute cholecystitis. There are innumerable small gallstones layering dependently in the gallbladder. Pancreas:  No mass, inflammation or ductal dilatation. Spleen:  Normal size.  No focal lesions. Adrenals/Urinary Tract: The adrenal glands and kidneys are unremarkable. Stomach/Bowel: Visualized portions within the abdomen are unremarkable. Vascular/Lymphatic: No pathologically enlarged lymph nodes identified. No abdominal aortic aneurysm demonstrated. Other: No ascites or abdominal wall hernia. Small thoracic disc protrusion noted on the left at T11-12. Musculoskeletal: No significant bony findings. IMPRESSION: 1. Cholelithiasis with numerous small layering  gallstones in the gallbladder but no findings for acute cholecystitis. 2. Mild common bile duct dilatation measuring a maximum of 7 mm. Suspect a small, 2.5 mm distal common bile duct stone. 3. Small hepatic cysts and a 3.5 cm left hepatic lobe hemangioma. 4. No other significant abdominal findings. Electronically Signed   By: Marijo Sanes M.D.   On: 08/16/2016 08:45    ROSNegative except above  Blood pressure 104/86, pulse 86, temperature 98.3 F (36.8 C), temperature source Oral, resp. rate 19, SpO2 98 %. Physical Exam No acute distress vital signs stable afebrile lungs are clear regular rate and rhythm abdomen is soft nontender currently labs and MRI and previous ultrasounds were reviewed  Assessment/Plan: CBD stone Plan: The risks benefits methods and success rate of ERCP and stone extraction was discussed with the patient and the husband and will proceed tomorrow morning with probable laparoscopic cholecystectomy on Wednesday and we'll allow clear liquids today but nothing by mouth after midnight  Rochell Mabie E 08/16/2016, 11:50 AM

## 2016-08-16 NOTE — Progress Notes (Signed)
PROGRESS NOTE    Victoria Weiss  DSK:[id number] DOB: 1965-03-06 DOA: 08/15/2016 PCP: Jonathon Jordan, MD   Outpatient Specialists:     Brief Narrative:  Victoria Weiss is a 51 y.o. female with medical history significant of depression, endometriosis, hyperlipidemia, IBS who is coming to the emergency department with complaints of abdominal pain since yesterday evening 2100 after she ate lasagna for dinner. She was seeing at the [facility] ED Last Night, treated for pain, but workup was not done due to lack of ultrasound capability last night another facility. She describes the pain as being burning like, deep in her back, radiating around her back and abdomen. She denies fever, chills, nausea, emesis, diarrhea, constipation, melena or hematochezia. She denies dysuria, frequency or gross hematuria. An ultrasound of the right upper quadrant is showing multiple gallstones without sonographic evidence of cholecystitis. There was also a solid appearing mass 2.4 x 1.9 x 2.0 cm in the left lobe of the liver. MRI of the abdomen was obtained showing a hemangioma and a retained stone in distal CBD.   Assessment & Plan:   Principal Problem:   Acute gallstone pancreatitis Active Problems:   Hyperlipidemia   Depression   Abnormal LFTs   Liver mass   Acute gallstone pancreatitis with + MRCP with retained stone IVF Continue analgesics as needed. Antiemetics as needed. MRCP: Lipase only mildly elevated here but was 241 at [facility] ER on 6/16 GI consult to [facility] GI as patient has an [place] PCP General surgery consulted by admitting Dr     Abnormal LFTs Secondary to above. MRCP + Suspects needs ERCP-- defer to GI consult    Hyperlipidemia LDL 133 Low triglycerides    Depression Continue fluoxetine 10 mg by mouth daily. Continue Wellbutrin XL 300 mg by mouth daily.      Liver mass MRCP shows benign hemangioma   DVT prophylaxis:  SCD's in anticipation of procedure  Code Status: Full  Code   Family Communication:   Disposition Plan:    Consultants:   General surgery  GI      Subjective: Pain free currently  Objective: Vitals:   08/15/16 2140 08/15/16 2300 08/15/16 2356 08/16/16 0518  BP: 117/72 115/61 126/79 104/86  Pulse:   70 86  Resp: 11 13 14 19   Temp:   98.3 F (36.8 C) 98.3 F (36.8 C)  TempSrc:   Oral Oral  SpO2:   98% 98%   No intake or output data in the 24 hours ending 08/16/16 1011 There were no vitals filed for this visit.  Examination:  General exam: Appears calm and comfortable  Respiratory system: Clear to auscultation. Respiratory effort normal. Cardiovascular system: S1 & S2 heard, RRR. No JVD, murmurs, rubs, gallops or clicks. No pedal edema. Gastrointestinal system: Abdomen is mildly tender. Normal bowel sounds heard. Central nervous system: Alert and oriented. No focal neurological deficits. Extremities: Symmetric 5 x 5 power. Skin: No rashes, lesions or ulcers Psychiatry: Judgement and insight appear normal. Mood & affect appropriate.     Data Reviewed: I have personally reviewed following labs and imaging studies  CBC:  Recent Labs Lab 08/15/16 1921 08/16/16 0355  WBC 5.9 5.2  HGB 13.4 12.2  HCT 39.9 37.7  MCV 92.4 94.0  PLT 277 620   Basic Metabolic Panel:  Recent Labs Lab 08/15/16 1921 08/15/16 2303 08/16/16 0355  NA 137  --  142  K 3.5  --  4.1  CL 105  --  108  CO2 23  --  28  GLUCOSE 129*  --  94  BUN 9  --  7  CREATININE 0.91  --  0.82  CALCIUM 9.3  --  8.7*  MG  --  2.3  --    GFR: CrCl cannot be calculated (Unknown ideal weight.). Liver Function Tests:  Recent Labs Lab 08/15/16 1921 08/16/16 0355  AST 599* 590*  ALT 532* 609*  ALKPHOS 155* 168*  BILITOT 4.3* 4.6*  PROT 6.9 6.0*  ALBUMIN 4.0 3.5    Recent Labs Lab 08/15/16 1921 08/16/16 0355  LIPASE 55* 41   No results for input(s): AMMONIA in the last 168 hours. Coagulation Profile: No results for input(s): INR,  PROTIME in the last 168 hours. Cardiac Enzymes: No results for input(s): CKTOTAL, CKMB, CKMBINDEX, TROPONINI in the last 168 hours. BNP (last 3 results) No results for input(s): PROBNP in the last 8760 hours. HbA1C: No results for input(s): HGBA1C in the last 72 hours. CBG:  Recent Labs Lab 08/16/16 0025 08/16/16 0517  GLUCAP 92 82   Lipid Profile:  Recent Labs  08/16/16 0355  CHOL 220*  HDL 79  LDLCALC 133*  TRIG 39  CHOLHDL 2.8   Thyroid Function Tests: No results for input(s): TSH, T4TOTAL, FREET4, T3FREE, THYROIDAB in the last 72 hours. Anemia Panel: No results for input(s): VITAMINB12, FOLATE, FERRITIN, TIBC, IRON, RETICCTPCT in the last 72 hours. Urine analysis:    Component Value Date/Time   COLORURINE AMBER (A) 08/15/2016 1929   APPEARANCEUR CLOUDY (A) 08/15/2016 1929   LABSPEC 1.017 08/15/2016 1929   PHURINE 7.0 08/15/2016 1929   GLUCOSEU NEGATIVE 08/15/2016 1929   HGBUR NEGATIVE 08/15/2016 1929   BILIRUBINUR SMALL (A) 08/15/2016 1929   KETONESUR NEGATIVE 08/15/2016 1929   PROTEINUR NEGATIVE 08/15/2016 1929   UROBILINOGEN 0.2 07/06/2014 0704   NITRITE NEGATIVE 08/15/2016 1929   LEUKOCYTESUR NEGATIVE 08/15/2016 1929     )No results found for this or any previous visit (from the past 240 hour(s)).    Anti-infectives    None       Radiology Studies: Mr 3d Recon At Scanner  Result Date: 08/16/2016 CLINICAL DATA:  Intermittent abdominal pain for the past 10 days. Back and abdominal pain and numerous gallstones on ultrasound. EXAM: MRI ABDOMEN WITHOUT AND WITH CONTRAST (INCLUDING MRCP) TECHNIQUE: Multiplanar multisequence MR imaging of the abdomen was performed both before and after the administration of intravenous contrast. Heavily T2-weighted images of the biliary and pancreatic ducts were obtained, and three-dimensional MRCP images were rendered by post processing. CONTRAST:  77mL MULTIHANCE GADOBENATE DIMEGLUMINE 529 MG/ML IV SOLN COMPARISON:   Ultrasound 08/15/2016 FINDINGS: Lower chest: The lung base is are clear. No worrisome pulmonary lesions, pleural or pericardial effusion. Hepatobiliary: There are small scattered hepatic cysts. The largest cyst is in segment 6 near the right kidney and measures 15 mm. There is also a 3.5 cm lesion in segment 3. This has typical MR imaging features of a benign hepatic hemangioma. Peripheral nodular enhancement with complete filling and on delayed images and enhancement similar to the blood pool on delayed images. No worrisome hepatic lesions. Myelo old intrahepatic biliary dilatation and mild common bile duct dilatation with maximum measurement of 7 mm. Findings suspicious for a tiny distal common bile duct stone measuring approximately 2.5 mm. This is best seen on coronal sequences, series 3, image 11. It is more difficult to see on the dedicated MRCP images but there does appear to be a reversed meniscus sign. No findings for acute cholecystitis. There are  innumerable small gallstones layering dependently in the gallbladder. Pancreas:  No mass, inflammation or ductal dilatation. Spleen:  Normal size.  No focal lesions. Adrenals/Urinary Tract: The adrenal glands and kidneys are unremarkable. Stomach/Bowel: Visualized portions within the abdomen are unremarkable. Vascular/Lymphatic: No pathologically enlarged lymph nodes identified. No abdominal aortic aneurysm demonstrated. Other: No ascites or abdominal wall hernia. Small thoracic disc protrusion noted on the left at T11-12. Musculoskeletal: No significant bony findings. IMPRESSION: 1. Cholelithiasis with numerous small layering gallstones in the gallbladder but no findings for acute cholecystitis. 2. Mild common bile duct dilatation measuring a maximum of 7 mm. Suspect a small, 2.5 mm distal common bile duct stone. 3. Small hepatic cysts and a 3.5 cm left hepatic lobe hemangioma. 4. No other significant abdominal findings. Electronically Signed   By: Marijo Sanes  M.D.   On: 08/16/2016 08:45   US Abdomen Limited  Result Date: 08/15/2016 CLINICAL DATA:  Right upper quadrant pain. EXAM: ULTRASOUND ABDOMEN LIMITED RIGHT UPPER QUADRANT COMPARISON:  None. FINDINGS: Gallbladder: The gallbladder is normally dilated. No evidence of gallbladder wall thickening. Gallbladder wall measures maximum diameter of 2 mm. No sonographic Murphy's sign. Numerous small layering mobile gallstones, the largest measuring 4 mm. Common bile duct: Diameter: 7 mm Liver: There is a circumscribed isoechoic to liver parenchyma subcapsular mass in the left lobe of the liver measuring 2.4 x 1.9 x 2.2 cm. Simple appearing right lobe cyst measures 1.6 cm. Normal direction of flow of the main portal vein. IMPRESSION: Numerous gallstones without sonographic evidence of acute cholecystitis. Borderline dilation of the extrahepatic common bile duct. Please correlate clinically. Solid-appearing 2.4 cm left hepatic subcapsular mass. Further evaluation with MRI of the abdomen, liver protocol may be considered. Electronically Signed   By: Fidela Salisbury M.D.   On: 08/15/2016 22:09   Mr Abdomen Mrcp Moise Boring Contast  Result Date: 08/16/2016 CLINICAL DATA:  Intermittent abdominal pain for the past 10 days. Back and abdominal pain and numerous gallstones on ultrasound. EXAM: MRI ABDOMEN WITHOUT AND WITH CONTRAST (INCLUDING MRCP) TECHNIQUE: Multiplanar multisequence MR imaging of the abdomen was performed both before and after the administration of intravenous contrast. Heavily T2-weighted images of the biliary and pancreatic ducts were obtained, and three-dimensional MRCP images were rendered by post processing. CONTRAST:  77mL MULTIHANCE GADOBENATE DIMEGLUMINE 529 MG/ML IV SOLN COMPARISON:  Ultrasound 08/15/2016 FINDINGS: Lower chest: The lung base is are clear. No worrisome pulmonary lesions, pleural or pericardial effusion. Hepatobiliary: There are small scattered hepatic cysts. The largest cyst is in segment 6  near the right kidney and measures 15 mm. There is also a 3.5 cm lesion in segment 3. This has typical MR imaging features of a benign hepatic hemangioma. Peripheral nodular enhancement with complete filling and on delayed images and enhancement similar to the blood pool on delayed images. No worrisome hepatic lesions. Myelo old intrahepatic biliary dilatation and mild common bile duct dilatation with maximum measurement of 7 mm. Findings suspicious for a tiny distal common bile duct stone measuring approximately 2.5 mm. This is best seen on coronal sequences, series 3, image 11. It is more difficult to see on the dedicated MRCP images but there does appear to be a reversed meniscus sign. No findings for acute cholecystitis. There are innumerable small gallstones layering dependently in the gallbladder. Pancreas:  No mass, inflammation or ductal dilatation. Spleen:  Normal size.  No focal lesions. Adrenals/Urinary Tract: The adrenal glands and kidneys are unremarkable. Stomach/Bowel: Visualized portions within the abdomen are unremarkable. Vascular/Lymphatic:  No pathologically enlarged lymph nodes identified. No abdominal aortic aneurysm demonstrated. Other: No ascites or abdominal wall hernia. Small thoracic disc protrusion noted on the left at T11-12. Musculoskeletal: No significant bony findings. IMPRESSION: 1. Cholelithiasis with numerous small layering gallstones in the gallbladder but no findings for acute cholecystitis. 2. Mild common bile duct dilatation measuring a maximum of 7 mm. Suspect a small, 2.5 mm distal common bile duct stone. 3. Small hepatic cysts and a 3.5 cm left hepatic lobe hemangioma. 4. No other significant abdominal findings. Electronically Signed   By: Marijo Sanes M.D.   On: 08/16/2016 08:45        Scheduled Meds: . buPROPion  300 mg Oral Daily  . FLUoxetine  10 mg Oral Daily  . lamoTRIgine  100 mg Oral Daily   Continuous Infusions: . 0.9 % NaCl with KCl 20 mEq / L 125  mL/hr at 08/16/16 0055  . famotidine (PEPCID) IV Stopped (08/16/16 0125)     LOS: 1 day    Time spent: 77 min    Rains, DO [facility] Pager [phone]  If 7PM-7AM, please contact night-coverage [email or web address] Password [email or web address] 08/16/2016, 10:11 AM

## 2016-08-16 NOTE — Consult Note (Signed)
[facility] Surgery Consult Note  Victoria Weiss [birth year]  [id number].    Requesting MD: Eliseo Squires Chief Complaint/Reason for Consult: choledocholithiasis  HPI:  Victoria Weiss is a 51yo female admitted to [facility] 6/17 with gallstone pancreatitis. Patient states that she has had intermittent abdominal pain for about 1.5 years, but it has recently become more frequent. The pain is epigastric and radiates to her back. Describes the pain as a burning sensation. This episode began last night after eating lasagna. Denies nausea, vomiting, fever, chills. Came to ED because the pain would not resolve.  Hospital workup: - lipase 55 on arrival, down to 41 today; LFTs elevated with bilirubin 4.3 on arrival and 4.6 today - u/s shows multiple gallstones without sonographic evidence of cholecystitis, borderline dilation of the extrahepatic common bile duct - MRCP 6/18 shows cholelithiasis, no acute cholecystitis, suspect small distal common bile duct stone  PMH significant for Depression, HLD, IBS Abdominal surgical history: tubal ligation, multiple laparoscopies for endometriosis, open procedure for ectopic pregnancy Nonsmoker Employment: desk job  ROS: Review of Systems  Constitutional: Negative.   HENT: Negative.   Eyes: Negative.   Respiratory: Negative.   Cardiovascular: Negative.   Gastrointestinal: Positive for abdominal pain. Negative for blood in stool, constipation, diarrhea, heartburn, melena, nausea and vomiting.  Genitourinary: Negative.   Musculoskeletal: Positive for back pain.  Skin: Negative.   Neurological: Negative.   All systems reviewed and otherwise negative except for as above  Family History  Problem Relation Age of Onset  . Prostate cancer Father        Living, 4  . Bladder Cancer Mother        Living, 63  . Hypertension Mother   . Aneurysm Mother        brain  . Colon cancer Maternal Grandfather   . Healthy Brother        x 2  . Healthy  Daughter     Past Medical History:  Diagnosis Date  . Depression   . Endometriosis   . Hyperlipidemia   . IBS (irritable bowel syndrome)     Past Surgical History:  Procedure Laterality Date  . BACK SURGERY    . ECTOPIC PREGNANCY SURGERY    . LAPAROSCOPY    . SHOULDER SURGERY Bilateral   . TUBAL LIGATION      Social History:  reports that she has quit smoking. She has never used smokeless tobacco. She reports that she does not drink alcohol or use drugs.  Allergies: No Known Allergies  Medications Prior to Admission  Medication Sig Dispense Refill  . buPROPion (WELLBUTRIN XL) 300 MG 24 hr tablet Take 300 mg by mouth daily.    Marland Kitchen FLUoxetine (PROZAC) 10 MG capsule Take 10 mg by mouth daily.    Marland Kitchen lamoTRIgine (LAMICTAL) 100 MG tablet Take 100 mg by mouth daily.    . traMADol (ULTRAM) 50 MG tablet Take 50 mg by mouth every 6 (six) hours as needed (pain).   0  . oxyCODONE-acetaminophen (PERCOCET/ROXICET) 5-325 MG per tablet Take 1-2 tablets by mouth every 6 (six) hours as needed for severe pain. (Patient not taking: Reported on 08/15/2016) 15 tablet 0    Prior to Admission medications   Medication Sig Start Date End Date Taking? Authorizing Provider  buPROPion (WELLBUTRIN XL) 300 MG 24 hr tablet Take 300 mg by mouth daily.   Yes [provider]  FLUoxetine (PROZAC) 10 MG capsule Take 10 mg by mouth daily.   Yes [provider]  lamoTRIgine (LAMICTAL) 100 MG tablet Take 100 mg by mouth daily.   Yes [provider]  traMADol (ULTRAM) 50 MG tablet Take 50 mg by mouth every 6 (six) hours as needed (pain).  07/27/16  Yes [provider]  oxyCODONE-acetaminophen (PERCOCET/ROXICET) 5-325 MG per tablet Take 1-2 tablets by mouth every 6 (six) hours as needed for severe pain. Patient not taking: Reported on 08/15/2016 07/06/14   Carlisle Cater, PA-C    Blood pressure 104/86, pulse 86, temperature 98.3 F (36.8 C), temperature source Oral, resp. rate 19, SpO2  98 %. Physical Exam: General: pleasant, WD/WN white female who is laying in bed in NAD HEENT: head is normocephalic, atraumatic.  Sclera are noninjected.  Pupils equal and round.  Ears and nose without any masses or lesions.  Mouth is pink and moist. Dentition fair Heart: regular, rate, and rhythm.  No obvious murmurs, gallops, or rubs noted.  Palpable pedal pulses bilaterally Lungs: CTAB, no wheezes, rhonchi, or rales noted.  Respiratory effort nonlabored Abd: well healed low transverse incision and RLQ incision and multiple lap incisions, soft, NT/ND, +BS, no masses, hernias, or organomegaly MS: all 4 extremities are symmetrical with no cyanosis, clubbing, or edema. Skin: warm and dry with no masses, lesions, or rashes Psych: A&Ox3 with an appropriate affect. Neuro: cranial nerves grossly intact, extremity CSM intact bilaterally, normal speech  Results for orders placed or performed during the hospital encounter of 08/15/16 (from the past 48 hour(s))  Lipase, blood     Status: Abnormal   Collection Time: 08/15/16  7:21 PM  Result Value Ref Range   Lipase 55 (H) 11 - 51 U/L  Comprehensive metabolic panel     Status: Abnormal   Collection Time: 08/15/16  7:21 PM  Result Value Ref Range   Sodium 137 135 - 145 mmol/L   Potassium 3.5 3.5 - 5.1 mmol/L   Chloride 105 101 - 111 mmol/L   CO2 23 22 - 32 mmol/L   Glucose, Bld 129 (H) 65 - 99 mg/dL   BUN 9 6 - 20 mg/dL   Creatinine, Ser 0.91 0.44 - 1.00 mg/dL   Calcium 9.3 8.9 - 10.3 mg/dL   Total Protein 6.9 6.5 - 8.1 g/dL   Albumin 4.0 3.5 - 5.0 g/dL   AST 599 (H) 15 - 41 U/L   ALT 532 (H) 14 - 54 U/L   Alkaline Phosphatase 155 (H) 38 - 126 U/L   Total Bilirubin 4.3 (H) 0.3 - 1.2 mg/dL   GFR calc non Af Amer >60 >60 mL/min   GFR calc Af Amer >60 >60 mL/min    Comment: (NOTE) The eGFR has been calculated using the CKD EPI equation. This calculation has not been validated in all clinical situations. eGFR's persistently <60 mL/min signify  possible Chronic Kidney Disease.    Anion gap 9 5 - 15  CBC     Status: None   Collection Time: 08/15/16  7:21 PM  Result Value Ref Range   WBC 5.9 4.0 - 10.5 K/uL   RBC 4.32 3.87 - 5.11 MIL/uL   Hemoglobin 13.4 12.0 - 15.0 g/dL   HCT 39.9 36.0 - 46.0 %   MCV 92.4 78.0 - 100.0 fL   MCH 31.0 26.0 - 34.0 pg   MCHC 33.6 30.0 - 36.0 g/dL   RDW 12.7 11.5 - 15.5 %   Platelets 277 150 - 400 K/uL  Urinalysis, Routine w reflex microscopic     Status: Abnormal  Collection Time: 08/15/16  7:29 PM  Result Value Ref Range   Color, Urine AMBER (A) YELLOW    Comment: BIOCHEMICALS MAY BE AFFECTED BY COLOR   APPearance CLOUDY (A) CLEAR   Specific Gravity, Urine 1.017 1.005 - 1.030   pH 7.0 5.0 - 8.0   Glucose, UA NEGATIVE NEGATIVE mg/dL   Hgb urine dipstick NEGATIVE NEGATIVE   Bilirubin Urine SMALL (A) NEGATIVE   Ketones, ur NEGATIVE NEGATIVE mg/dL   Protein, ur NEGATIVE NEGATIVE mg/dL   Nitrite NEGATIVE NEGATIVE   Leukocytes, UA NEGATIVE NEGATIVE  POC urine preg, ED     Status: None   Collection Time: 08/15/16  7:56 PM  Result Value Ref Range   Preg Test, Ur NEGATIVE NEGATIVE    Comment:        THE SENSITIVITY OF THIS METHODOLOGY IS >24 mIU/mL   Magnesium     Status: None   Collection Time: 08/15/16 11:03 PM  Result Value Ref Range   Magnesium 2.3 1.7 - 2.4 mg/dL  Glucose, capillary     Status: None   Collection Time: 08/16/16 12:25 AM  Result Value Ref Range   Glucose-Capillary 92 65 - 99 mg/dL  Comprehensive metabolic panel     Status: Abnormal   Collection Time: 08/16/16  3:55 AM  Result Value Ref Range   Sodium 142 135 - 145 mmol/L   Potassium 4.1 3.5 - 5.1 mmol/L   Chloride 108 101 - 111 mmol/L   CO2 28 22 - 32 mmol/L   Glucose, Bld 94 65 - 99 mg/dL   BUN 7 6 - 20 mg/dL   Creatinine, Ser 0.82 0.44 - 1.00 mg/dL   Calcium 8.7 (L) 8.9 - 10.3 mg/dL   Total Protein 6.0 (L) 6.5 - 8.1 g/dL   Albumin 3.5 3.5 - 5.0 g/dL   AST 590 (H) 15 - 41 U/L   ALT 609 (H) 14 - 54 U/L    Alkaline Phosphatase 168 (H) 38 - 126 U/L   Total Bilirubin 4.6 (H) 0.3 - 1.2 mg/dL   GFR calc non Af Amer >60 >60 mL/min   GFR calc Af Amer >60 >60 mL/min    Comment: (NOTE) The eGFR has been calculated using the CKD EPI equation. This calculation has not been validated in all clinical situations. eGFR's persistently <60 mL/min signify possible Chronic Kidney Disease.    Anion gap 6 5 - 15  CBC     Status: None   Collection Time: 08/16/16  3:55 AM  Result Value Ref Range   WBC 5.2 4.0 - 10.5 K/uL   RBC 4.01 3.87 - 5.11 MIL/uL   Hemoglobin 12.2 12.0 - 15.0 g/dL   HCT 37.7 36.0 - 46.0 %   MCV 94.0 78.0 - 100.0 fL   MCH 30.4 26.0 - 34.0 pg   MCHC 32.4 30.0 - 36.0 g/dL   RDW 13.0 11.5 - 15.5 %   Platelets 214 150 - 400 K/uL  Lipase, blood     Status: None   Collection Time: 08/16/16  3:55 AM  Result Value Ref Range   Lipase 41 11 - 51 U/L  Lipid panel     Status: Abnormal   Collection Time: 08/16/16  3:55 AM  Result Value Ref Range   Cholesterol 220 (H) 0 - 200 mg/dL   Triglycerides 39 <150 mg/dL   HDL 79 >40 mg/dL   Total CHOL/HDL Ratio 2.8 RATIO   VLDL 8 0 - 40 mg/dL   LDL Cholesterol 133 (H)  0 - 99 mg/dL    Comment:        Total Cholesterol/HDL:CHD Risk Coronary Heart Disease Risk Table                     Men   Women  1/2 Average Risk   3.4   3.3  Average Risk       5.0   4.4  2 X Average Risk   9.6   7.1  3 X Average Risk  23.4   11.0        Use the calculated Patient Ratio above and the CHD Risk Table to determine the patient's CHD Risk.        ATP III CLASSIFICATION (LDL):  <100     mg/dL   Optimal  100-129  mg/dL   Near or Above                    Optimal  130-159  mg/dL   Borderline  160-189  mg/dL   High  >190     mg/dL   Very High   Bilirubin, direct     Status: Abnormal   Collection Time: 08/16/16  3:55 AM  Result Value Ref Range   Bilirubin, Direct 2.7 (H) 0.1 - 0.5 mg/dL  Glucose, capillary     Status: None   Collection Time: 08/16/16  5:17 AM   Result Value Ref Range   Glucose-Capillary 82 65 - 99 mg/dL   Mr 3d Recon At Scanner  Result Date: 08/16/2016 CLINICAL DATA:  Intermittent abdominal pain for the past 10 days. Back and abdominal pain and numerous gallstones on ultrasound. EXAM: MRI ABDOMEN WITHOUT AND WITH CONTRAST (INCLUDING MRCP) TECHNIQUE: Multiplanar multisequence MR imaging of the abdomen was performed both before and after the administration of intravenous contrast. Heavily T2-weighted images of the biliary and pancreatic ducts were obtained, and three-dimensional MRCP images were rendered by post processing. CONTRAST:  75m MULTIHANCE GADOBENATE DIMEGLUMINE 529 MG/ML IV SOLN COMPARISON:  Ultrasound 08/15/2016 FINDINGS: Lower chest: The lung base is are clear. No worrisome pulmonary lesions, pleural or pericardial effusion. Hepatobiliary: There are small scattered hepatic cysts. The largest cyst is in segment 6 near the right kidney and measures 15 mm. There is also a 3.5 cm lesion in segment 3. This has typical MR imaging features of a benign hepatic hemangioma. Peripheral nodular enhancement with complete filling and on delayed images and enhancement similar to the blood pool on delayed images. No worrisome hepatic lesions. Myelo old intrahepatic biliary dilatation and mild common bile duct dilatation with maximum measurement of 7 mm. Findings suspicious for a tiny distal common bile duct stone measuring approximately 2.5 mm. This is best seen on coronal sequences, series 3, image 11. It is more difficult to see on the dedicated MRCP images but there does appear to be a reversed meniscus sign. No findings for acute cholecystitis. There are innumerable small gallstones layering dependently in the gallbladder. Pancreas:  No mass, inflammation or ductal dilatation. Spleen:  Normal size.  No focal lesions. Adrenals/Urinary Tract: The adrenal glands and kidneys are unremarkable. Stomach/Bowel: Visualized portions within the abdomen are  unremarkable. Vascular/Lymphatic: No pathologically enlarged lymph nodes identified. No abdominal aortic aneurysm demonstrated. Other: No ascites or abdominal wall hernia. Small thoracic disc protrusion noted on the left at T11-12. Musculoskeletal: No significant bony findings. IMPRESSION: 1. Cholelithiasis with numerous small layering gallstones in the gallbladder but no findings for acute cholecystitis. 2. Mild common bile duct dilatation  measuring a maximum of 7 mm. Suspect a small, 2.5 mm distal common bile duct stone. 3. Small hepatic cysts and a 3.5 cm left hepatic lobe hemangioma. 4. No other significant abdominal findings. Electronically Signed   By: Marijo Sanes M.D.   On: 08/16/2016 08:45   US Abdomen Limited  Result Date: 08/15/2016 CLINICAL DATA:  Right upper quadrant pain. EXAM: ULTRASOUND ABDOMEN LIMITED RIGHT UPPER QUADRANT COMPARISON:  None. FINDINGS: Gallbladder: The gallbladder is normally dilated. No evidence of gallbladder wall thickening. Gallbladder wall measures maximum diameter of 2 mm. No sonographic Murphy's sign. Numerous small layering mobile gallstones, the largest measuring 4 mm. Common bile duct: Diameter: 7 mm Liver: There is a circumscribed isoechoic to liver parenchyma subcapsular mass in the left lobe of the liver measuring 2.4 x 1.9 x 2.2 cm. Simple appearing right lobe cyst measures 1.6 cm. Normal direction of flow of the main portal vein. IMPRESSION: Numerous gallstones without sonographic evidence of acute cholecystitis. Borderline dilation of the extrahepatic common bile duct. Please correlate clinically. Solid-appearing 2.4 cm left hepatic subcapsular mass. Further evaluation with MRI of the abdomen, liver protocol may be considered. Electronically Signed   By: Fidela Salisbury M.D.   On: 08/15/2016 22:09   Mr Abdomen Mrcp Moise Boring Contast  Result Date: 08/16/2016 CLINICAL DATA:  Intermittent abdominal pain for the past 10 days. Back and abdominal pain and numerous  gallstones on ultrasound. EXAM: MRI ABDOMEN WITHOUT AND WITH CONTRAST (INCLUDING MRCP) TECHNIQUE: Multiplanar multisequence MR imaging of the abdomen was performed both before and after the administration of intravenous contrast. Heavily T2-weighted images of the biliary and pancreatic ducts were obtained, and three-dimensional MRCP images were rendered by post processing. CONTRAST:  4m MULTIHANCE GADOBENATE DIMEGLUMINE 529 MG/ML IV SOLN COMPARISON:  Ultrasound 08/15/2016 FINDINGS: Lower chest: The lung base is are clear. No worrisome pulmonary lesions, pleural or pericardial effusion. Hepatobiliary: There are small scattered hepatic cysts. The largest cyst is in segment 6 near the right kidney and measures 15 mm. There is also a 3.5 cm lesion in segment 3. This has typical MR imaging features of a benign hepatic hemangioma. Peripheral nodular enhancement with complete filling and on delayed images and enhancement similar to the blood pool on delayed images. No worrisome hepatic lesions. Myelo old intrahepatic biliary dilatation and mild common bile duct dilatation with maximum measurement of 7 mm. Findings suspicious for a tiny distal common bile duct stone measuring approximately 2.5 mm. This is best seen on coronal sequences, series 3, image 11. It is more difficult to see on the dedicated MRCP images but there does appear to be a reversed meniscus sign. No findings for acute cholecystitis. There are innumerable small gallstones layering dependently in the gallbladder. Pancreas:  No mass, inflammation or ductal dilatation. Spleen:  Normal size.  No focal lesions. Adrenals/Urinary Tract: The adrenal glands and kidneys are unremarkable. Stomach/Bowel: Visualized portions within the abdomen are unremarkable. Vascular/Lymphatic: No pathologically enlarged lymph nodes identified. No abdominal aortic aneurysm demonstrated. Other: No ascites or abdominal wall hernia. Small thoracic disc protrusion noted on the left at  T11-12. Musculoskeletal: No significant bony findings. IMPRESSION: 1. Cholelithiasis with numerous small layering gallstones in the gallbladder but no findings for acute cholecystitis. 2. Mild common bile duct dilatation measuring a maximum of 7 mm. Suspect a small, 2.5 mm distal common bile duct stone. 3. Small hepatic cysts and a 3.5 cm left hepatic lobe hemangioma. 4. No other significant abdominal findings. Electronically Signed   By: PMarijo Sanes  M.D.   On: 08/16/2016 08:45   Anti-infectives    None        Assessment/Plan Depression HLD IBS  Cholelithiasis, choledocholithiasis Acute gallstone pancreatitis - abdominal surgical history: tubal ligation, multiple laparoscopies for endometriosis, open procedure for ectopic pregnancy - lipase 55 on arrival, down to 41 today; LFTs elevated with bilirubin 4.3 on arrival and 4.6 today - u/s shows multiple gallstones without sonographic evidence of cholecystitis, borderline dilation of the extrahepatic common bile duct - MRCP 6/18 shows cholelithiasis, no acute cholecystitis, suspect small distal common bile duct stone  ID - none VTE - SCDs FEN - IVF, clear liquids, NPO after midnight  Plan - GI planning ERCP tomorrow morning. Patient will need labs (CBC, CMP, lipase) the morning after procedure, and if stable then will proceed with laparoscopic cholecystectomy.  Jerrye Beavers, [facility] 08/16/2016, 10:57 AM Pager: [phone] Consults: [phone] Mon-Fri 7:00 am-4:30 pm Sat-Sun 7:00 am-11:30 am

## 2016-08-17 ENCOUNTER — Encounter (HOSPITAL_COMMUNITY): Payer: Self-pay | Admitting: Gastroenterology

## 2016-08-17 ENCOUNTER — Encounter (HOSPITAL_COMMUNITY)
Admission: EM | Disposition: A | Discharge status: Home / Self Care | Payer: Self-pay | Source: Home / Self Care | Attending: Internal Medicine

## 2016-08-17 ENCOUNTER — Inpatient Hospital Stay (HOSPITAL_COMMUNITY): Payer: BLUE CROSS/BLUE SHIELD | Admitting: Certified Registered Nurse Anesthetist

## 2016-08-17 ENCOUNTER — Inpatient Hospital Stay (HOSPITAL_COMMUNITY): Payer: BLUE CROSS/BLUE SHIELD

## 2016-08-17 DIAGNOSIS — R945 Abnormal results of liver function studies: Secondary | ICD-10-CM

## 2016-08-17 HISTORY — PX: ERCP: SHX5425

## 2016-08-17 HISTORY — PX: CHOLECYSTECTOMY: SHX55

## 2016-08-17 LAB — CBC
HCT: 35.8 % — ABNORMAL LOW (ref 36.0–46.0)
Hemoglobin: 11.6 g/dL — ABNORMAL LOW (ref 12.0–15.0)
MCH: 30.8 pg (ref 26.0–34.0)
MCHC: 32.4 g/dL (ref 30.0–36.0)
MCV: 95 fL (ref 78.0–100.0)
Platelets: 197 10*3/uL (ref 150–400)
RBC: 3.77 MIL/uL — ABNORMAL LOW (ref 3.87–5.11)
RDW: 13.2 % (ref 11.5–15.5)
WBC: 4.9 10*3/uL (ref 4.0–10.5)

## 2016-08-17 LAB — COMPREHENSIVE METABOLIC PANEL
ALT: 492 U/L — ABNORMAL HIGH (ref 14–54)
AST: 261 U/L — ABNORMAL HIGH (ref 15–41)
Albumin: 3.3 g/dL — ABNORMAL LOW (ref 3.5–5.0)
Alkaline Phosphatase: 194 U/L — ABNORMAL HIGH (ref 38–126)
Anion gap: 8 (ref 5–15)
BUN: <5 mg/dL — ABNORMAL LOW (ref 6–20)
CO2: 23 mmol/L (ref 22–32)
Calcium: 8.5 mg/dL — ABNORMAL LOW (ref 8.9–10.3)
Chloride: 109 mmol/L (ref 101–111)
Creatinine, Ser: 0.75 mg/dL (ref 0.44–1.00)
GFR calc Af Amer: >60 mL/min (ref >60)
GFR calc non Af Amer: >60 mL/min (ref >60)
Glucose, Bld: 86 mg/dL (ref 65–99)
Potassium: 3.4 mmol/L — ABNORMAL LOW (ref 3.5–5.1)
Sodium: 140 mmol/L (ref 135–145)
Total Bilirubin: 1.9 mg/dL — ABNORMAL HIGH (ref 0.3–1.2)
Total Protein: 5.8 g/dL — ABNORMAL LOW (ref 6.5–8.1)

## 2016-08-17 LAB — LIPASE, BLOOD: Lipase: 33 U/L (ref 11–51)

## 2016-08-17 LAB — GLUCOSE, CAPILLARY
Glucose-Capillary: 144 mg/dL — ABNORMAL HIGH (ref 65–99)
Glucose-Capillary: 147 mg/dL — ABNORMAL HIGH (ref 65–99)
Glucose-Capillary: 80 mg/dL (ref 65–99)
Glucose-Capillary: 83 mg/dL (ref 65–99)

## 2016-08-17 LAB — SURGICAL PCR SCREEN
MRSA, PCR: NEGATIVE
Staphylococcus aureus: NEGATIVE

## 2016-08-17 SURGERY — ERCP, WITH INTERVENTION IF INDICATED
Anesthesia: General | Site: Abdomen

## 2016-08-17 MED ORDER — MIDAZOLAM HCL 5 MG/5ML IJ SOLN
INTRAMUSCULAR | Status: DC | PRN
Start: 2016-08-17 — End: 2016-08-17
  Administered 2016-08-17: 1 mg via INTRAVENOUS

## 2016-08-17 MED ORDER — SODIUM CHLORIDE 0.9 % IR SOLN
Status: DC | PRN
Start: 2016-08-17 — End: 2016-08-17
  Administered 2016-08-17 (×2): 1000 mL

## 2016-08-17 MED ORDER — DEXAMETHASONE SODIUM PHOSPHATE 10 MG/ML IJ SOLN
INTRAMUSCULAR | Status: DC | PRN
  Administered 2016-08-17: 10 mg via INTRAVENOUS

## 2016-08-17 MED ORDER — ONDANSETRON HCL 4 MG/2ML IJ SOLN
INTRAMUSCULAR | Status: DC | PRN
  Administered 2016-08-17 (×2): 4 mg via INTRAVENOUS

## 2016-08-17 MED ORDER — DIPHENHYDRAMINE HCL 25 MG PO CAPS: 25.0000 mg | ORAL_CAPSULE | Freq: Four times a day (QID) | ORAL | Status: DC | PRN

## 2016-08-17 MED ORDER — SODIUM CHLORIDE 0.9 % IV SOLN
INTRAVENOUS | Status: DC | PRN
  Administered 2016-08-17: 30 mL

## 2016-08-17 MED ORDER — LIDOCAINE 2% (20 MG/ML) 5 ML SYRINGE
INTRAMUSCULAR | Status: DC | PRN
  Administered 2016-08-17: 100 mg via INTRAVENOUS

## 2016-08-17 MED ORDER — SCOPOLAMINE 1 MG/3DAYS TD PT72
MEDICATED_PATCH | TRANSDERMAL | Status: DC | PRN
  Administered 2016-08-17: 1 via TRANSDERMAL

## 2016-08-17 MED ORDER — OXYCODONE HCL 5 MG PO TABS
5.0000 mg | ORAL_TABLET | ORAL | Status: DC | PRN
  Administered 2016-08-17 – 2016-08-18 (×2): 5 mg via ORAL
  Filled 2016-08-17 (×2): qty 1

## 2016-08-17 MED ORDER — CHLORHEXIDINE GLUCONATE 0.12 % MT SOLN: 15.0000 mL | Freq: Two times a day (BID) | OROMUCOSAL | Status: DC

## 2016-08-17 MED ORDER — FENTANYL CITRATE (PF) 250 MCG/5ML IJ SOLN
INTRAMUSCULAR | Status: AC
  Filled 2016-08-17: qty 5

## 2016-08-17 MED ORDER — ENOXAPARIN SODIUM 40 MG/0.4ML ~~LOC~~ SOLN: 40.0000 mg | SUBCUTANEOUS | Status: DC

## 2016-08-17 MED ORDER — EPHEDRINE SULFATE 50 MG/ML IJ SOLN
INTRAMUSCULAR | Status: DC | PRN
  Administered 2016-08-17 (×2): 10 mg via INTRAVENOUS

## 2016-08-17 MED ORDER — LACTATED RINGERS IV SOLN
INTRAVENOUS | Status: DC
  Administered 2016-08-17 (×2): via INTRAVENOUS

## 2016-08-17 MED ORDER — DEXTROSE 5 % IV SOLN
INTRAVENOUS | Status: DC | PRN
  Administered 2016-08-17: 20 ug/min via INTRAVENOUS

## 2016-08-17 MED ORDER — PROPOFOL 10 MG/ML IV BOLUS
INTRAVENOUS | Status: DC | PRN
Start: 2016-08-17 — End: 2016-08-17
  Administered 2016-08-17: 150 mg via INTRAVENOUS

## 2016-08-17 MED ORDER — HYDROMORPHONE HCL 1 MG/ML IJ SOLN
0.5000 mg | INTRAMUSCULAR | Status: DC | PRN
  Administered 2016-08-17 – 2016-08-18 (×4): 1 mg via INTRAVENOUS
  Filled 2016-08-17 (×4): qty 1

## 2016-08-17 MED ORDER — FENTANYL CITRATE (PF) 100 MCG/2ML IJ SOLN
INTRAMUSCULAR | Status: DC | PRN
Start: 2016-08-17 — End: 2016-08-17
  Administered 2016-08-17 (×2): 25 ug via INTRAVENOUS
  Administered 2016-08-17: 50 ug via INTRAVENOUS
  Administered 2016-08-17: 150 ug via INTRAVENOUS

## 2016-08-17 MED ORDER — PROPOFOL 10 MG/ML IV BOLUS
INTRAVENOUS | Status: AC
  Filled 2016-08-17: qty 20

## 2016-08-17 MED ORDER — KETOROLAC TROMETHAMINE 30 MG/ML IJ SOLN: 30.0000 mg | Freq: Once | INTRAMUSCULAR | Status: DC | PRN

## 2016-08-17 MED ORDER — ROCURONIUM BROMIDE 10 MG/ML (PF) SYRINGE
PREFILLED_SYRINGE | INTRAVENOUS | Status: DC | PRN
Start: 2016-08-17 — End: 2016-08-17
  Administered 2016-08-17: 10 mg via INTRAVENOUS
  Administered 2016-08-17: 60 mg via INTRAVENOUS

## 2016-08-17 MED ORDER — MIDAZOLAM HCL 2 MG/2ML IJ SOLN
INTRAMUSCULAR | Status: AC
  Filled 2016-08-17: qty 2

## 2016-08-17 MED ORDER — BUPIVACAINE-EPINEPHRINE 0.25% -1:200000 IJ SOLN
INTRAMUSCULAR | Status: DC | PRN
Start: 2016-08-17 — End: 2016-08-17
  Administered 2016-08-17: 13 mL

## 2016-08-17 MED ORDER — PHENYLEPHRINE 40 MCG/ML (10ML) SYRINGE FOR IV PUSH (FOR BLOOD PRESSURE SUPPORT)
PREFILLED_SYRINGE | INTRAVENOUS | Status: DC | PRN
Start: 2016-08-17 — End: 2016-08-17
  Administered 2016-08-17 (×2): 80 ug via INTRAVENOUS
  Administered 2016-08-17: 120 ug via INTRAVENOUS
  Administered 2016-08-17 (×3): 80 ug via INTRAVENOUS

## 2016-08-17 MED ORDER — SCOPOLAMINE 1 MG/3DAYS TD PT72
MEDICATED_PATCH | TRANSDERMAL | Status: AC
  Filled 2016-08-17: qty 1

## 2016-08-17 MED ORDER — DOCUSATE SODIUM 100 MG PO CAPS: 100.0000 mg | ORAL_CAPSULE | Freq: Two times a day (BID) | ORAL | Status: DC | PRN

## 2016-08-17 MED ORDER — ACETAMINOPHEN 325 MG PO TABS: 650.0000 mg | ORAL_TABLET | Freq: Four times a day (QID) | ORAL | Status: DC | PRN

## 2016-08-17 MED ORDER — PROMETHAZINE HCL 25 MG/ML IJ SOLN: 6.2500 mg | INTRAMUSCULAR | Status: DC | PRN

## 2016-08-17 MED ORDER — HYDROMORPHONE HCL 1 MG/ML IJ SOLN
0.5000 mg | INTRAMUSCULAR | Status: DC | PRN
Start: 2016-08-17 — End: 2016-08-17

## 2016-08-17 MED ORDER — IOPAMIDOL (ISOVUE-300) INJECTION 61%
INTRAVENOUS | Status: AC
  Filled 2016-08-17: qty 50

## 2016-08-17 MED ORDER — MEPERIDINE HCL 25 MG/ML IJ SOLN: 6.2500 mg | INTRAMUSCULAR | Status: DC | PRN

## 2016-08-17 MED ORDER — BUPIVACAINE-EPINEPHRINE (PF) 0.25% -1:200000 IJ SOLN
INTRAMUSCULAR | Status: AC
  Filled 2016-08-17: qty 30

## 2016-08-17 MED ORDER — 0.9 % SODIUM CHLORIDE (POUR BTL) OPTIME
TOPICAL | Status: DC | PRN
  Administered 2016-08-17: 1000 mL

## 2016-08-17 MED ORDER — SUGAMMADEX SODIUM 200 MG/2ML IV SOLN
INTRAVENOUS | Status: DC | PRN
  Administered 2016-08-17: 100 mg via INTRAVENOUS

## 2016-08-17 MED ORDER — ORAL CARE MOUTH RINSE: 15.0000 mL | Freq: Two times a day (BID) | OROMUCOSAL | Status: DC

## 2016-08-17 MED ORDER — HYDROMORPHONE HCL 1 MG/ML IJ SOLN: 0.2500 mg | INTRAMUSCULAR | Status: DC | PRN

## 2016-08-17 SURGICAL SUPPLY — 44 items
APL SKNCLS STERI-STRIP NONHPOA (GAUZE/BANDAGES/DRESSINGS) ×1
APPLIER CLIP ROT 10 11.4 M/L (STAPLE) ×2
APR CLP MED LRG 11.4X10 (STAPLE) ×1
BAG SPEC RTRVL LRG 6X4 10 (ENDOMECHANICALS) ×1
BENZOIN TINCTURE PRP APPL 2/3 (GAUZE/BANDAGES/DRESSINGS) ×2 IMPLANT
BLADE CLIPPER SURG (BLADE) ×2 IMPLANT
CANISTER SUCT 3000ML PPV (MISCELLANEOUS) ×2 IMPLANT
CHLORAPREP W/TINT 26ML (MISCELLANEOUS) ×2 IMPLANT
CLIP APPLIE ROT 10 11.4 M/L (STAPLE) ×1 IMPLANT
CLSR STERI-STRIP ANTIMIC 1/2X4 (GAUZE/BANDAGES/DRESSINGS) ×2 IMPLANT
COVER MAYO STAND STRL (DRAPES) ×2 IMPLANT
COVER SURGICAL LIGHT HANDLE (MISCELLANEOUS) ×2 IMPLANT
DRAPE C-ARM 42X72 X-RAY (DRAPES) ×2 IMPLANT
DRSG TEGADERM 2-3/8X2-3/4 SM (GAUZE/BANDAGES/DRESSINGS) ×8 IMPLANT
DRSG TEGADERM 4X4.75 (GAUZE/BANDAGES/DRESSINGS) ×2 IMPLANT
ELECT REM PT RETURN 9FT ADLT (ELECTROSURGICAL) ×2
ELECTRODE REM PT RTRN 9FT ADLT (ELECTROSURGICAL) ×1 IMPLANT
FILTER SMOKE EVAC LAPAROSHD (FILTER) ×2 IMPLANT
GAUZE SPONGE 2X2 8PLY STRL LF (GAUZE/BANDAGES/DRESSINGS) ×1 IMPLANT
GLOVE BIO SURGEON STRL SZ7 (GLOVE) ×2 IMPLANT
GLOVE BIOGEL PI IND STRL 7.5 (GLOVE) ×1 IMPLANT
GLOVE BIOGEL PI INDICATOR 7.5 (GLOVE) ×1
GOWN STRL REUS W/ TWL LRG LVL3 (GOWN DISPOSABLE) ×3 IMPLANT
GOWN STRL REUS W/TWL LRG LVL3 (GOWN DISPOSABLE) ×6
KIT BASIN OR (CUSTOM PROCEDURE TRAY) ×2 IMPLANT
KIT ROOM TURNOVER OR (KITS) ×2 IMPLANT
NS IRRIG 1000ML POUR BTL (IV SOLUTION) ×2 IMPLANT
PAD ARMBOARD 7.5X6 YLW CONV (MISCELLANEOUS) ×2 IMPLANT
POUCH SPECIMEN RETRIEVAL 10MM (ENDOMECHANICALS) ×2 IMPLANT
SCISSORS LAP 5X35 DISP (ENDOMECHANICALS) ×2 IMPLANT
SET CHOLANGIOGRAPH 5 50 .035 (SET/KITS/TRAYS/PACK) ×2 IMPLANT
SET IRRIG TUBING LAPAROSCOPIC (IRRIGATION / IRRIGATOR) ×2 IMPLANT
SLEEVE ENDOPATH XCEL 5M (ENDOMECHANICALS) ×2 IMPLANT
SPECIMEN JAR SMALL (MISCELLANEOUS) ×2 IMPLANT
SPONGE GAUZE 2X2 STER 10/PKG (GAUZE/BANDAGES/DRESSINGS) ×1
STRIP CLOSURE SKIN 1/2X4 (GAUZE/BANDAGES/DRESSINGS) ×2 IMPLANT
SUT MNCRL AB 4-0 PS2 18 (SUTURE) ×2 IMPLANT
TOWEL OR 17X24 6PK STRL BLUE (TOWEL DISPOSABLE) ×2 IMPLANT
TOWEL OR 17X26 10 PK STRL BLUE (TOWEL DISPOSABLE) IMPLANT
TRAY LAPAROSCOPIC MC (CUSTOM PROCEDURE TRAY) ×2 IMPLANT
TROCAR XCEL BLUNT TIP 100MML (ENDOMECHANICALS) ×2 IMPLANT
TROCAR XCEL NON-BLD 11X100MML (ENDOMECHANICALS) ×2 IMPLANT
TROCAR XCEL NON-BLD 5MMX100MML (ENDOMECHANICALS) ×2 IMPLANT
TUBING INSUFFLATION (TUBING) ×2 IMPLANT

## 2016-08-17 NOTE — Op Note (Signed)
[facility] Patient Name: Victoria Weiss Procedure Date : 08/17/2016 MRN: [id number] Attending MD: Clarene Essex , MD Date of Birth: 01/08/1966 CSN: [id number] Age: 51 Admit Type: Inpatient Procedure:                ERCP Indications:              Suspected bile duct stone(s)On MRCP Providers:                Clarene Essex, MD, Cleda Daub, RN, Elspeth Cho                            Tech., Technician Referring MD:              Medicines:                General Anesthesia Complications:            No immediate complications. Estimated Blood Loss:     Estimated blood loss: none. Procedure:                Pre-Anesthesia Assessment:                           - Prior to the procedure, a History and Physical                            was performed, and patient medications and                            allergies were reviewed. The patient's tolerance of                            previous anesthesia was also reviewed. The risks                            and benefits of the procedure and the sedation                            options and risks were discussed with the patient.                            All questions were answered, and informed consent                            was obtained. Prior Anticoagulants: The patient has                            taken heparin, last dose was 1 day prior to                            procedure. ASA Grade Assessment: I - A normal,                            healthy patient. After reviewing the risks and  benefits, the patient was deemed in satisfactory                            condition to undergo the procedure.                           After obtaining informed consent, the scope was                            passed under direct vision. Throughout the                            procedure, the patient's blood pressure, pulse, and                            oxygen saturations were monitored continuously. The                           [id number]) scope was introduced through                            the mouth, and used to inject contrast into and                            used to locate the major papilla. The ERCP was                            accomplished without difficulty. The patient                            tolerated the procedure well. Findings:      The major papilla was slightly bulging. Biliary sphincterotomy was made       with a Hydratome sphincterotome using ERBE electrocautery. There was no       post-sphincterotomy bleeding. Choledocholithiasis was found in a       nondilated duct. The common bile duct contained one stone, which was       small in diameter. The biliary tree was swept with a 9- 12 mm adjustable       balloon starting at the bifurcation. One stone was removed on the first       pull-through. No stones remained. 3 subsequent balloon pull-through's       were done using both a 9?12 millimeter balloon and an occlusion       cholangiogram was done which was normal and there was adequate biliary       drainage and during the procedure there was cystic duct filling an       obvious gallstones seen and no pancreatic duct wire advancement or       injections throughout the procedure Impression:               - The major papilla appeared to be slightly bulging.                           - Choledocholithiasis was found. Complete removal  was accomplished by biliary sphincterotomy and                            balloon extraction.                           - A biliary sphincterotomy was performed.                           - The biliary tree was swept. there was adequate                            biliary drainage and a negative occlusion                            cholangiogram was done distended the procedure Moderate Sedation:      moderate sedation-none Recommendation:           - Avoid aspirin and nonsteroidal anti-inflammatory                             medicines for 3 days.                           - Continue present medications. follow liver tests                            back to normal                           - Return to GI clinic PRN.                           - Telephone GI clinic if symptomatic PRN.                           - Clear liquid diet and advance as per surgical                            team. Procedure Code(s):        --- Professional ---                           [phone], Esophagogastroduodenoscopy, flexible,                            transoral; diagnostic, including collection of                            specimen(s) by brushing or washing, when performed                            (separate procedure) Diagnosis Code(s):        --- Professional ---                           K83.9, Disease of biliary tract, unspecified  K80.50, Calculus of bile duct without cholangitis                            or cholecystitis without obstruction CPT copyright 2016 [employer]. All rights reserved. The codes documented in this report are preliminary and upon coder review may  be revised to meet current compliance requirements. Clarene Essex, MD 08/17/2016 10:46:49 AM This report has been signed electronically. Number of Addenda: 0

## 2016-08-17 NOTE — Progress Notes (Signed)
PROGRESS NOTE    Victoria Weiss  EZM:[id number] DOB: 07-28-1965 DOA: 08/15/2016 PCP: Jonathon Jordan, MD   Outpatient Specialists:     Brief Narrative:  Victoria Weiss is a 51 y.o. female with medical history significant of depression, endometriosis, hyperlipidemia, IBS who is coming to the emergency department with complaints of abdominal pain since yesterday evening 2100 after she ate lasagna for dinner. She was seeing at the [facility] ED Last Night, treated for pain, but workup was not done due to lack of ultrasound capability last night another facility. She describes the pain as being burning like, deep in her back, radiating around her back and abdomen. She denies fever, chills, nausea, emesis, diarrhea, constipation, melena or hematochezia. She denies dysuria, frequency or gross hematuria. An ultrasound of the right upper quadrant is showing multiple gallstones without sonographic evidence of cholecystitis. There was also a solid appearing mass 2.4 x 1.9 x 2.0 cm in the left lobe of the liver. MRI of the abdomen was obtained showing a hemangioma and a retained stone in distal CBD.   Assessment & Plan:   Principal Problem:   Acute gallstone pancreatitis Active Problems:   Hyperlipidemia   Depression   Abnormal LFTs   Liver mass   Acute gallstone pancreatitis with + MRCP with retained stone IVF Continue analgesics as needed. Antiemetics as needed. MRCP: + Lipase only mildly elevated here but was 241 at [facility] ER on 6/16 GI consult to [facility] GI appreciated as patient has an [place] PCP General surgery consulted by admitting Dr- plan for lap chole     Abnormal LFTs Secondary to above. MRCP + For ERCP today with lap chole    Hyperlipidemia LDL 133 Low triglycerides    Depression Continue fluoxetine 10 mg by mouth daily. Continue Wellbutrin XL 300 mg by mouth daily.      Liver mass MRCP shows benign hemangioma  Hypokalemia -replete PO  DVT prophylaxis:   SCD's  Code Status: Full Code   Family Communication:   Disposition Plan:    Consultants:   General surgery  GI      Subjective: Pain free currently- had a good night For ERCP and cholecystectomy today  Objective: Vitals:   08/15/16 2356 08/16/16 0518 08/16/16 2152 08/17/16 0502  BP: 126/79 104/86 116/63 (!) 115/59  Pulse: 70 86 83 68  Resp: 14 19 18 18   Temp: 98.3 F (36.8 C) 98.3 F (36.8 C) 98.4 F (36.9 C) 98 F (36.7 C)  TempSrc: Oral Oral Oral Oral  SpO2: 98% 98% 97% 100%    Intake/Output Summary (Last 24 hours) at 08/17/16 0835 Last data filed at 08/17/16 0700  Gross per 24 hour  Intake              817 ml  Output              900 ml  Net              -83 ml   There were no vitals filed for this visit.  Examination:  General exam: NAD- in bed  Respiratory system: clear, no wheezing Cardiovascular system: rrr, no murmurs Gastrointestinal system: min tender Central nervous system: A+Ox3, NAD Extremities: moves all 4 ext Skin: no rash .     Data Reviewed: I have personally reviewed following labs and imaging studies  CBC:  Recent Labs Lab 08/15/16 1921 08/16/16 0355 08/17/16 0531  WBC 5.9 5.2 4.9  HGB 13.4 12.2 11.6*  HCT 39.9 37.7 35.8*  MCV 92.4 94.0 95.0  PLT 277 214 161   Basic Metabolic Panel:  Recent Labs Lab 08/15/16 1921 08/15/16 2303 08/16/16 0355 08/17/16 0531  NA 137  --  142 140  K 3.5  --  4.1 3.4*  CL 105  --  108 109  CO2 23  --  28 23  GLUCOSE 129*  --  94 86  BUN 9  --  7 <5*  CREATININE 0.91  --  0.82 0.75  CALCIUM 9.3  --  8.7* 8.5*  MG  --  2.3  --   --    GFR: CrCl cannot be calculated (Unknown ideal weight.). Liver Function Tests:  Recent Labs Lab 08/15/16 1921 08/16/16 0355 08/17/16 0531  AST 599* 590* 261*  ALT 532* 609* 492*  ALKPHOS 155* 168* 194*  BILITOT 4.3* 4.6* 1.9*  PROT 6.9 6.0* 5.8*  ALBUMIN 4.0 3.5 3.3*    Recent Labs Lab 08/15/16 1921 08/16/16 0355  08/17/16 0531  LIPASE 55* 41 33   No results for input(s): AMMONIA in the last 168 hours. Coagulation Profile: No results for input(s): INR, PROTIME in the last 168 hours. Cardiac Enzymes: No results for input(s): CKTOTAL, CKMB, CKMBINDEX, TROPONINI in the last 168 hours. BNP (last 3 results) No results for input(s): PROBNP in the last 8760 hours. HbA1C: No results for input(s): HGBA1C in the last 72 hours. CBG:  Recent Labs Lab 08/16/16 0517 08/16/16 1206 08/16/16 1737 08/17/16 0000 08/17/16 0722  GLUCAP 82 77 75 80 83   Lipid Profile:  Recent Labs  08/16/16 0355  CHOL 220*  HDL 79  LDLCALC 133*  TRIG 39  CHOLHDL 2.8   Thyroid Function Tests: No results for input(s): TSH, T4TOTAL, FREET4, T3FREE, THYROIDAB in the last 72 hours. Anemia Panel: No results for input(s): VITAMINB12, FOLATE, FERRITIN, TIBC, IRON, RETICCTPCT in the last 72 hours. Urine analysis:    Component Value Date/Time   COLORURINE AMBER (A) 08/15/2016 1929   APPEARANCEUR CLOUDY (A) 08/15/2016 1929   LABSPEC 1.017 08/15/2016 1929   PHURINE 7.0 08/15/2016 1929   GLUCOSEU NEGATIVE 08/15/2016 1929   HGBUR NEGATIVE 08/15/2016 1929   BILIRUBINUR SMALL (A) 08/15/2016 1929   KETONESUR NEGATIVE 08/15/2016 1929   PROTEINUR NEGATIVE 08/15/2016 1929   UROBILINOGEN 0.2 07/06/2014 0704   NITRITE NEGATIVE 08/15/2016 1929   LEUKOCYTESUR NEGATIVE 08/15/2016 1929     ) Recent Results (from the past 240 hour(s))  Surgical pcr screen     Status: None   Collection Time: 08/16/16 10:15 PM  Result Value Ref Range Status   MRSA, PCR NEGATIVE NEGATIVE Final   Staphylococcus aureus NEGATIVE NEGATIVE Final    Comment:        The Xpert SA Assay (FDA approved for NASAL specimens in patients over 74 years of age), is one component of a comprehensive surveillance program.  Test performance has been validated by [facility] for patients greater than or equal to 75 year old. It is not intended to diagnose  infection nor to guide or monitor treatment.       Anti-infectives    Start     Dose/Rate Route Frequency Ordered Stop   08/17/16 0600  [MAR Hold]  ciprofloxacin (CIPRO) IVPB 400 mg     (MAR Hold since 08/17/16 0816)   400 mg 200 mL/hr over 60 Minutes Intravenous To Surgery 08/16/16 1518 08/18/16 0600       Radiology Studies: Mr 3d Recon At Scanner  Result Date: 08/16/2016 CLINICAL DATA:  Intermittent  abdominal pain for the past 10 days. Back and abdominal pain and numerous gallstones on ultrasound. EXAM: MRI ABDOMEN WITHOUT AND WITH CONTRAST (INCLUDING MRCP) TECHNIQUE: Multiplanar multisequence MR imaging of the abdomen was performed both before and after the administration of intravenous contrast. Heavily T2-weighted images of the biliary and pancreatic ducts were obtained, and three-dimensional MRCP images were rendered by post processing. CONTRAST:  63mL MULTIHANCE GADOBENATE DIMEGLUMINE 529 MG/ML IV SOLN COMPARISON:  Ultrasound 08/15/2016 FINDINGS: Lower chest: The lung base is are clear. No worrisome pulmonary lesions, pleural or pericardial effusion. Hepatobiliary: There are small scattered hepatic cysts. The largest cyst is in segment 6 near the right kidney and measures 15 mm. There is also a 3.5 cm lesion in segment 3. This has typical MR imaging features of a benign hepatic hemangioma. Peripheral nodular enhancement with complete filling and on delayed images and enhancement similar to the blood pool on delayed images. No worrisome hepatic lesions. Myelo old intrahepatic biliary dilatation and mild common bile duct dilatation with maximum measurement of 7 mm. Findings suspicious for a tiny distal common bile duct stone measuring approximately 2.5 mm. This is best seen on coronal sequences, series 3, image 11. It is more difficult to see on the dedicated MRCP images but there does appear to be a reversed meniscus sign. No findings for acute cholecystitis. There are innumerable small  gallstones layering dependently in the gallbladder. Pancreas:  No mass, inflammation or ductal dilatation. Spleen:  Normal size.  No focal lesions. Adrenals/Urinary Tract: The adrenal glands and kidneys are unremarkable. Stomach/Bowel: Visualized portions within the abdomen are unremarkable. Vascular/Lymphatic: No pathologically enlarged lymph nodes identified. No abdominal aortic aneurysm demonstrated. Other: No ascites or abdominal wall hernia. Small thoracic disc protrusion noted on the left at T11-12. Musculoskeletal: No significant bony findings. IMPRESSION: 1. Cholelithiasis with numerous small layering gallstones in the gallbladder but no findings for acute cholecystitis. 2. Mild common bile duct dilatation measuring a maximum of 7 mm. Suspect a small, 2.5 mm distal common bile duct stone. 3. Small hepatic cysts and a 3.5 cm left hepatic lobe hemangioma. 4. No other significant abdominal findings. Electronically Signed   By: Marijo Sanes M.D.   On: 08/16/2016 08:45   US Abdomen Limited  Result Date: 08/15/2016 CLINICAL DATA:  Right upper quadrant pain. EXAM: ULTRASOUND ABDOMEN LIMITED RIGHT UPPER QUADRANT COMPARISON:  None. FINDINGS: Gallbladder: The gallbladder is normally dilated. No evidence of gallbladder wall thickening. Gallbladder wall measures maximum diameter of 2 mm. No sonographic Murphy's sign. Numerous small layering mobile gallstones, the largest measuring 4 mm. Common bile duct: Diameter: 7 mm Liver: There is a circumscribed isoechoic to liver parenchyma subcapsular mass in the left lobe of the liver measuring 2.4 x 1.9 x 2.2 cm. Simple appearing right lobe cyst measures 1.6 cm. Normal direction of flow of the main portal vein. IMPRESSION: Numerous gallstones without sonographic evidence of acute cholecystitis. Borderline dilation of the extrahepatic common bile duct. Please correlate clinically. Solid-appearing 2.4 cm left hepatic subcapsular mass. Further evaluation with MRI of the  abdomen, liver protocol may be considered. Electronically Signed   By: Fidela Salisbury M.D.   On: 08/15/2016 22:09   Mr Abdomen Mrcp Moise Boring Contast  Result Date: 08/16/2016 CLINICAL DATA:  Intermittent abdominal pain for the past 10 days. Back and abdominal pain and numerous gallstones on ultrasound. EXAM: MRI ABDOMEN WITHOUT AND WITH CONTRAST (INCLUDING MRCP) TECHNIQUE: Multiplanar multisequence MR imaging of the abdomen was performed both before and after the administration of  intravenous contrast. Heavily T2-weighted images of the biliary and pancreatic ducts were obtained, and three-dimensional MRCP images were rendered by post processing. CONTRAST:  71mL MULTIHANCE GADOBENATE DIMEGLUMINE 529 MG/ML IV SOLN COMPARISON:  Ultrasound 08/15/2016 FINDINGS: Lower chest: The lung base is are clear. No worrisome pulmonary lesions, pleural or pericardial effusion. Hepatobiliary: There are small scattered hepatic cysts. The largest cyst is in segment 6 near the right kidney and measures 15 mm. There is also a 3.5 cm lesion in segment 3. This has typical MR imaging features of a benign hepatic hemangioma. Peripheral nodular enhancement with complete filling and on delayed images and enhancement similar to the blood pool on delayed images. No worrisome hepatic lesions. Myelo old intrahepatic biliary dilatation and mild common bile duct dilatation with maximum measurement of 7 mm. Findings suspicious for a tiny distal common bile duct stone measuring approximately 2.5 mm. This is best seen on coronal sequences, series 3, image 11. It is more difficult to see on the dedicated MRCP images but there does appear to be a reversed meniscus sign. No findings for acute cholecystitis. There are innumerable small gallstones layering dependently in the gallbladder. Pancreas:  No mass, inflammation or ductal dilatation. Spleen:  Normal size.  No focal lesions. Adrenals/Urinary Tract: The adrenal glands and kidneys are unremarkable.  Stomach/Bowel: Visualized portions within the abdomen are unremarkable. Vascular/Lymphatic: No pathologically enlarged lymph nodes identified. No abdominal aortic aneurysm demonstrated. Other: No ascites or abdominal wall hernia. Small thoracic disc protrusion noted on the left at T11-12. Musculoskeletal: No significant bony findings. IMPRESSION: 1. Cholelithiasis with numerous small layering gallstones in the gallbladder but no findings for acute cholecystitis. 2. Mild common bile duct dilatation measuring a maximum of 7 mm. Suspect a small, 2.5 mm distal common bile duct stone. 3. Small hepatic cysts and a 3.5 cm left hepatic lobe hemangioma. 4. No other significant abdominal findings. Electronically Signed   By: Marijo Sanes M.D.   On: 08/16/2016 08:45        Scheduled Meds: . [MAR Hold] buPROPion  300 mg Oral Daily  . [MAR Hold] chlorhexidine  15 mL Mouth Rinse BID  . [MAR Hold] FLUoxetine  10 mg Oral Daily  . [MAR Hold] lamoTRIgine  100 mg Oral Daily  . [MAR Hold] mouth rinse  15 mL Mouth Rinse q12n4p   Continuous Infusions: . sodium chloride    . 0.9 % NaCl with KCl 20 mEq / L 125 mL/hr at 08/16/16 2347  . [MAR Hold] ciprofloxacin    . [MAR Hold] famotidine (PEPCID) IV Stopped (08/16/16 2152)  . lactated ringers       LOS: 2 days    Time spent: 35 min    [place], DO [facility] Pager [phone]  If 7PM-7AM, please contact night-coverage [email or web address] Password [facility] 08/17/2016, 8:35 AM

## 2016-08-17 NOTE — Op Note (Signed)
Laparoscopic Cholecystectomy Procedure Note  Indications: This patient presents with symptomatic gallbladder disease and will undergo laparoscopic cholecystectomy.  She has choledocholithiasis and has just finished an ERCP by Dr. Watt Climes  Pre-operative Diagnosis: Calculus of gallbladder without mention of cholecystitis, with obstruction  Post-operative Diagnosis: Same  Surgeon: Maia Petties.   Assistants: Jackson Latino, PA-C  Anesthesia: General endotracheal anesthesia  ASA Class: 1  Procedure Details  The patient was seen again in the Holding Room. The risks, benefits, complications, treatment options, and expected outcomes were discussed with the patient. The possibilities of reaction to medication, pulmonary aspiration, perforation of viscus, bleeding, recurrent infection, finding a normal gallbladder, the need for additional procedures, failure to diagnose a condition, the possible need to convert to an open procedure, and creating a complication requiring transfusion or operation were discussed with the patient. The likelihood of improving the patient's symptoms with return to their baseline status is good.  The patient and/or family concurred with the proposed plan, giving informed consent. The site of surgery properly noted. The patient was taken to Operating Room, identified as Victoria Weiss and the procedure verified as Laparoscopic Cholecystectomy with Intraoperative Cholangiogram. She had just completed an ERCP by Dr. Watt Climes and was repositioned for this position.  A Time Out was held and the above information confirmed.  The abdomen was prepped with Chloraprep and draped in sterile fashion. The patient was positioned in the supine position.  Local anesthetic agent was injected into the skin near the umbilicus and an incision made. We dissected down to the abdominal fascia with blunt dissection.  The fascia was incised vertically and we entered the peritoneal cavity bluntly.  A  pursestring suture of 0-Vicryl was placed around the fascial opening.  The Hasson cannula was inserted and secured with the stay suture.  Pneumoperitoneum was then created with CO2 and tolerated well without any adverse changes in the patient's vital signs. An 11-mm port was placed in the subxiphoid position.  Two 5-mm ports were placed in the right upper quadrant. All skin incisions were infiltrated with a local anesthetic agent before making the incision and placing the trocars.   We positioned the patient in reverse Trendelenburg, tilted slightly to the patient's left.  The gallbladder was identified, the fundus grasped and retracted cephalad. Adhesions were lysed bluntly and with the electrocautery where indicated, taking care not to injure any adjacent organs or viscus. The infundibulum was grasped and retracted laterally, exposing the peritoneum overlying the triangle of Calot. This was then divided and exposed in a blunt fashion. The cystic duct was clearly identified and bluntly dissected circumferentially. A critical view of the cystic duct and cystic artery was obtained.  The cystic duct was then ligated with clips and divided. The cystic artery was, dissected free, ligated with clips and divided as well.   The gallbladder was dissected from the liver bed in retrograde fashion with the electrocautery. Some stones were spilled during dissection.The gallbladder was removed and placed in an Endocatch sac. The spilled stones were removed. The liver bed was irrigated and inspected. Hemostasis was achieved with the electrocautery. Copious irrigation was utilized and was repeatedly aspirated until clear.  The gallbladder and Endocatch sac were then removed through the umbilical port site.  The pursestring suture was used to close the umbilical fascia.    We again inspected the right upper quadrant for hemostasis.  Pneumoperitoneum was released as we removed the trocars.  4-0 Monocryl was used to close the  skin.  Benzoin, steri-strips, and clean dressings were applied. The patient was then extubated and brought to the recovery room in stable condition. Instrument, sponge, and needle counts were correct at closure and at the conclusion of the case.   Findings: Chronic cholecystitis with Cholelithiasis  Estimated Blood Loss: Minimal         Drains: none         Specimens: Gallbladder           Complications: None; patient tolerated the procedure well.         Disposition: PACU - hemodynamically stable.         Condition: stable

## 2016-08-17 NOTE — Anesthesia Postprocedure Evaluation (Signed)
Anesthesia Post Note  Patient: Victoria Weiss  Procedure(s) Performed: Procedure(s) (LRB): ENDOSCOPIC RETROGRADE CHOLANGIOPANCREATOGRAPHY (ERCP) (N/A) LAPAROSCOPIC CHOLECYSTECTOMY WITH INTRAOPERATIVE CHOLANGIOGRAM (N/A)     Patient location during evaluation: PACU Anesthesia Type: General Level of consciousness: awake and sedated Pain management: pain level controlled Vital Signs Assessment: post-procedure vital signs reviewed and stable Respiratory status: spontaneous breathing Cardiovascular status: stable Postop Assessment: no signs of nausea or vomiting Anesthetic complications: no    Last Vitals:  Vitals:   08/17/16 1250 08/17/16 1303  BP: (!) 113/54 111/63  Pulse: 72 67  Resp: 20 16  Temp: 36.6 C 36.3 C    Last Pain:  Vitals:   08/17/16 1220  TempSrc:   PainSc: Asleep   Pain Goal:                 Octavion Mollenkopf JR,JOHN Eulan Heyward

## 2016-08-17 NOTE — Progress Notes (Signed)
Day of Surgery   Subjective/Chief Complaint: Minimal abdominal pain     Objective: Vital signs in last 24 hours: Temp:  [98 F (36.7 C)-98.4 F (36.9 C)] 98 F (36.7 C) (06/19 0502) Pulse Rate:  [68-83] 68 (06/19 0502) Resp:  [18] 18 (06/19 0502) BP: (115-116)/(59-63) 115/59 (06/19 0502) SpO2:  [97 %-100 %] 100 % (06/19 0502) Last BM Date: 08/16/16  Intake/Output from previous day: 06/18 0701 - 06/19 0700 In: 817 [P.O.:240; I.V.:527; IV Piggyback:50] Out: 900 [Urine:900] Intake/Output this shift: No intake/output data recorded.  General appearance: alert, cooperative and no distress Resp: clear to auscultation bilaterally Cardio: regular rate and rhythm, S1, S2 normal, no murmur, click, rub or gallop GI: soft, minimal tenderness in RUQ No sign of jaundice  Lab Results:   Recent Labs  08/16/16 0355 08/17/16 0531  WBC 5.2 4.9  HGB 12.2 11.6*  HCT 37.7 35.8*  PLT 214 197   BMET  Recent Labs  08/16/16 0355 08/17/16 0531  NA 142 140  K 4.1 3.4*  CL 108 109  CO2 28 23  GLUCOSE 94 86  BUN 7 <5*  CREATININE 0.82 0.75  CALCIUM 8.7* 8.5*   Hepatic Function Latest Ref Rng & Units 08/17/2016 08/16/2016 08/15/2016  Total Protein 6.5 - 8.1 g/dL 5.8(L) 6.0(L) 6.9  Albumin 3.5 - 5.0 g/dL 3.3(L) 3.5 4.0  AST 15 - 41 U/L 261(H) 590(H) 599(H)  ALT 14 - 54 U/L 492(H) 609(H) 532(H)  Alk Phosphatase 38 - 126 U/L 194(H) 168(H) 155(H)  Total Bilirubin 0.3 - 1.2 mg/dL 1.9(H) 4.6(H) 4.3(H)  Bilirubin, Direct 0.1 - 0.5 mg/dL - 2.7(H) -   Lab Results  Component Value Date   LIPASE 33 08/17/2016    PT/INR No results for input(s): LABPROT, INR in the last 72 hours. ABG No results for input(s): PHART, HCO3 in the last 72 hours.  Invalid input(s): PCO2, PO2  Studies/Results: Mr 3d Recon At Scanner  Result Date: 08/16/2016 CLINICAL DATA:  Intermittent abdominal pain for the past 10 days. Back and abdominal pain and numerous gallstones on ultrasound. EXAM: MRI ABDOMEN  WITHOUT AND WITH CONTRAST (INCLUDING MRCP) TECHNIQUE: Multiplanar multisequence MR imaging of the abdomen was performed both before and after the administration of intravenous contrast. Heavily T2-weighted images of the biliary and pancreatic ducts were obtained, and three-dimensional MRCP images were rendered by post processing. CONTRAST:  94m MULTIHANCE GADOBENATE DIMEGLUMINE 529 MG/ML IV SOLN COMPARISON:  Ultrasound 08/15/2016 FINDINGS: Lower chest: The lung base is are clear. No worrisome pulmonary lesions, pleural or pericardial effusion. Hepatobiliary: There are small scattered hepatic cysts. The largest cyst is in segment 6 near the right kidney and measures 15 mm. There is also a 3.5 cm lesion in segment 3. This has typical MR imaging features of a benign hepatic hemangioma. Peripheral nodular enhancement with complete filling and on delayed images and enhancement similar to the blood pool on delayed images. No worrisome hepatic lesions. Myelo old intrahepatic biliary dilatation and mild common bile duct dilatation with maximum measurement of 7 mm. Findings suspicious for a tiny distal common bile duct stone measuring approximately 2.5 mm. This is best seen on coronal sequences, series 3, image 11. It is more difficult to see on the dedicated MRCP images but there does appear to be a reversed meniscus sign. No findings for acute cholecystitis. There are innumerable small gallstones layering dependently in the gallbladder. Pancreas:  No mass, inflammation or ductal dilatation. Spleen:  Normal size.  No focal lesions. Adrenals/Urinary Tract: The  adrenal glands and kidneys are unremarkable. Stomach/Bowel: Visualized portions within the abdomen are unremarkable. Vascular/Lymphatic: No pathologically enlarged lymph nodes identified. No abdominal aortic aneurysm demonstrated. Other: No ascites or abdominal wall hernia. Small thoracic disc protrusion noted on the left at T11-12. Musculoskeletal: No significant  bony findings. IMPRESSION: 1. Cholelithiasis with numerous small layering gallstones in the gallbladder but no findings for acute cholecystitis. 2. Mild common bile duct dilatation measuring a maximum of 7 mm. Suspect a small, 2.5 mm distal common bile duct stone. 3. Small hepatic cysts and a 3.5 cm left hepatic lobe hemangioma. 4. No other significant abdominal findings. Electronically Signed   By: Marijo Sanes M.D.   On: 08/16/2016 08:45   US Abdomen Limited  Result Date: 08/15/2016 CLINICAL DATA:  Right upper quadrant pain. EXAM: ULTRASOUND ABDOMEN LIMITED RIGHT UPPER QUADRANT COMPARISON:  None. FINDINGS: Gallbladder: The gallbladder is normally dilated. No evidence of gallbladder wall thickening. Gallbladder wall measures maximum diameter of 2 mm. No sonographic Murphy's sign. Numerous small layering mobile gallstones, the largest measuring 4 mm. Common bile duct: Diameter: 7 mm Liver: There is a circumscribed isoechoic to liver parenchyma subcapsular mass in the left lobe of the liver measuring 2.4 x 1.9 x 2.2 cm. Simple appearing right lobe cyst measures 1.6 cm. Normal direction of flow of the main portal vein. IMPRESSION: Numerous gallstones without sonographic evidence of acute cholecystitis. Borderline dilation of the extrahepatic common bile duct. Please correlate clinically. Solid-appearing 2.4 cm left hepatic subcapsular mass. Further evaluation with MRI of the abdomen, liver protocol may be considered. Electronically Signed   By: Fidela Salisbury M.D.   On: 08/15/2016 22:09   Mr Abdomen Mrcp Moise Boring Contast  Result Date: 08/16/2016 CLINICAL DATA:  Intermittent abdominal pain for the past 10 days. Back and abdominal pain and numerous gallstones on ultrasound. EXAM: MRI ABDOMEN WITHOUT AND WITH CONTRAST (INCLUDING MRCP) TECHNIQUE: Multiplanar multisequence MR imaging of the abdomen was performed both before and after the administration of intravenous contrast. Heavily T2-weighted images of the  biliary and pancreatic ducts were obtained, and three-dimensional MRCP images were rendered by post processing. CONTRAST:  25m MULTIHANCE GADOBENATE DIMEGLUMINE 529 MG/ML IV SOLN COMPARISON:  Ultrasound 08/15/2016 FINDINGS: Lower chest: The lung base is are clear. No worrisome pulmonary lesions, pleural or pericardial effusion. Hepatobiliary: There are small scattered hepatic cysts. The largest cyst is in segment 6 near the right kidney and measures 15 mm. There is also a 3.5 cm lesion in segment 3. This has typical MR imaging features of a benign hepatic hemangioma. Peripheral nodular enhancement with complete filling and on delayed images and enhancement similar to the blood pool on delayed images. No worrisome hepatic lesions. Myelo old intrahepatic biliary dilatation and mild common bile duct dilatation with maximum measurement of 7 mm. Findings suspicious for a tiny distal common bile duct stone measuring approximately 2.5 mm. This is best seen on coronal sequences, series 3, image 11. It is more difficult to see on the dedicated MRCP images but there does appear to be a reversed meniscus sign. No findings for acute cholecystitis. There are innumerable small gallstones layering dependently in the gallbladder. Pancreas:  No mass, inflammation or ductal dilatation. Spleen:  Normal size.  No focal lesions. Adrenals/Urinary Tract: The adrenal glands and kidneys are unremarkable. Stomach/Bowel: Visualized portions within the abdomen are unremarkable. Vascular/Lymphatic: No pathologically enlarged lymph nodes identified. No abdominal aortic aneurysm demonstrated. Other: No ascites or abdominal wall hernia. Small thoracic disc protrusion noted on the left at  T11-12. Musculoskeletal: No significant bony findings. IMPRESSION: 1. Cholelithiasis with numerous small layering gallstones in the gallbladder but no findings for acute cholecystitis. 2. Mild common bile duct dilatation measuring a maximum of 7 mm. Suspect a  small, 2.5 mm distal common bile duct stone. 3. Small hepatic cysts and a 3.5 cm left hepatic lobe hemangioma. 4. No other significant abdominal findings. Electronically Signed   By: Marijo Sanes M.D.   On: 08/16/2016 08:45    Anti-infectives: Anti-infectives    Start     Dose/Rate Route Frequency Ordered Stop   08/17/16 0600  ciprofloxacin (CIPRO) IVPB 400 mg     400 mg 200 mL/hr over 60 Minutes Intravenous To Surgery 08/16/16 1518 08/18/16 0600      Assessment/Plan: s/p Procedure(s): ENDOSCOPIC RETROGRADE CHOLANGIOPANCREATOGRAPHY (ERCP) (N/A) LAPAROSCOPIC CHOLECYSTECTOMY WITH INTRAOPERATIVE CHOLANGIOGRAM (N/A) Positive MRCP with 2.5 mm distal CBD stone. LFT's improving - discussed with Magod.  Will proceed with ERCP today followed by lap chole.    LOS: 2 days    Sherel Fennell K. 08/17/2016

## 2016-08-17 NOTE — Anesthesia Procedure Notes (Signed)
Procedure Name: Intubation Date/Time: 08/17/2016 10:00 AM Performed by: Merdis Delay Pre-anesthesia Checklist: Patient identified, Emergency Drugs available, Suction available, Patient being monitored and Timeout performed Patient Re-evaluated:Patient Re-evaluated prior to inductionOxygen Delivery Method: Circle system utilized Preoxygenation: Pre-oxygenation with 100% oxygen Intubation Type: IV induction Ventilation: Mask ventilation without difficulty Laryngoscope Size: Mac and 3 Grade View: Grade I Tube type: Oral Number of attempts: 3 Airway Equipment and Method: Stylet Placement Confirmation: ETT inserted through vocal cords under direct vision,  positive ETCO2 and breath sounds checked- equal and bilateral Secured at: 21 cm Tube secured with: Tape Dental Injury: Teeth and Oropharynx as per pre-operative assessment  Comments: DL with MAC 3- grade 1 view. No etco2. ETT removed and reinserted- No etco2- Grade 1 view. ETT removed and exchanged (?cuff integrity). New ETT then reinserted- +ETCO2.

## 2016-08-17 NOTE — Transfer of Care (Signed)
Immediate Anesthesia Transfer of Care Note  Patient: Victoria Weiss  Procedure(s) Performed: Procedure(s): ENDOSCOPIC RETROGRADE CHOLANGIOPANCREATOGRAPHY (ERCP) (N/A) LAPAROSCOPIC CHOLECYSTECTOMY WITH INTRAOPERATIVE CHOLANGIOGRAM (N/A)  Patient Location: PACU  Anesthesia Type:General  Level of Consciousness: awake, alert  and oriented  Airway & Oxygen Therapy: Patient Spontanous Breathing and Patient connected to nasal cannula oxygen  Post-op Assessment: Report given to RN and Post -op Vital signs reviewed and stable  Post vital signs: Reviewed and stable  Last Vitals:  Vitals:   08/17/16 0502 08/17/16 1220  BP: (!) 115/59 (!) 108/55  Pulse: 68 77  Resp: 18 20  Temp: 36.7 C (P) 36.5 C    Last Pain:  Vitals:   08/17/16 0502  TempSrc: Oral  PainSc:          Complications: No apparent anesthesia complications

## 2016-08-17 NOTE — Progress Notes (Signed)
Niel Hummer 8:55 AM  Subjective: Patient doing fine today and had a good night and no new complaints and case discussed with the surgeon and patient and husband as well  Objective: Vital signs stable afebrile exam please see preassessment evaluation labs improved LFTs still moderately elevated white count okay  Assessment: Probable CBD stone  Plan: We rediscussed ERCP versus holding off and waiting for and Intra-Op cholangiogram and the patient and her husband are in favor of proceeding with ERCP first and probable surgical cholecystectomy second  [facility]  Pager [phone] After 5PM or if no answer call [phone]

## 2016-08-17 NOTE — Anesthesia Preprocedure Evaluation (Addendum)
Anesthesia Evaluation  Patient identified by MRN, date of birth, ID band Patient awake    Reviewed: Allergy & Precautions, NPO status , Patient's Chart, lab work & pertinent test results  Airway Mallampati: I       Dental no notable dental hx. (+) Teeth Intact   Pulmonary neg pulmonary ROS, former smoker,    Pulmonary exam normal        Cardiovascular Normal cardiovascular exam Rhythm:Regular Rate:Normal     Neuro/Psych PSYCHIATRIC DISORDERS Depression negative neurological ROS     GI/Hepatic Neg liver ROS,   Endo/Other  negative endocrine ROS  Renal/GU negative Renal ROS  negative genitourinary   Musculoskeletal negative musculoskeletal ROS (+)   Abdominal Normal abdominal exam  (+)   Peds  Hematology negative hematology ROS (+)   Anesthesia Other Findings   Reproductive/Obstetrics                            Anesthesia Physical Anesthesia Plan  ASA: II  Anesthesia Plan: General   Post-op Pain Management:    Induction: Intravenous  PONV Risk Score and Plan: 4 or greater and Ondansetron, Dexamethasone, Propofol, Midazolam and Scopolamine patch - Pre-op  Airway Management Planned: Oral ETT  Additional Equipment:   Intra-op Plan:   Post-operative Plan: Extubation in OR  Informed Consent: I have reviewed the patients History and Physical, chart, labs and discussed the procedure including the risks, benefits and alternatives for the proposed anesthesia with the patient or authorized representative who has indicated his/her understanding and acceptance.   Dental advisory given  Plan Discussed with: CRNA and Surgeon  Anesthesia Plan Comments:         Anesthesia Quick Evaluation

## 2016-08-18 ENCOUNTER — Encounter (HOSPITAL_COMMUNITY): Payer: Self-pay | Admitting: Gastroenterology

## 2016-08-18 LAB — COMPREHENSIVE METABOLIC PANEL
ALT: 376 U/L — ABNORMAL HIGH (ref 14–54)
AST: 170 U/L — ABNORMAL HIGH (ref 15–41)
Albumin: 3 g/dL — ABNORMAL LOW (ref 3.5–5.0)
Alkaline Phosphatase: 167 U/L — ABNORMAL HIGH (ref 38–126)
Anion gap: 4 — ABNORMAL LOW (ref 5–15)
BUN: <5 mg/dL — ABNORMAL LOW (ref 6–20)
CO2: 27 mmol/L (ref 22–32)
Calcium: 8.6 mg/dL — ABNORMAL LOW (ref 8.9–10.3)
Chloride: 109 mmol/L (ref 101–111)
Creatinine, Ser: 0.85 mg/dL (ref 0.44–1.00)
GFR calc Af Amer: >60 mL/min (ref >60)
GFR calc non Af Amer: >60 mL/min (ref >60)
Glucose, Bld: 97 mg/dL (ref 65–99)
Potassium: 4 mmol/L (ref 3.5–5.1)
Sodium: 140 mmol/L (ref 135–145)
Total Bilirubin: 1.2 mg/dL (ref 0.3–1.2)
Total Protein: 5.6 g/dL — ABNORMAL LOW (ref 6.5–8.1)

## 2016-08-18 LAB — CBC
HCT: 34.1 % — ABNORMAL LOW (ref 36.0–46.0)
Hemoglobin: 10.9 g/dL — ABNORMAL LOW (ref 12.0–15.0)
MCH: 30.8 pg (ref 26.0–34.0)
MCHC: 32 g/dL (ref 30.0–36.0)
MCV: 96.3 fL (ref 78.0–100.0)
Platelets: 198 10*3/uL (ref 150–400)
RBC: 3.54 MIL/uL — ABNORMAL LOW (ref 3.87–5.11)
RDW: 13.3 % (ref 11.5–15.5)
WBC: 6.7 10*3/uL (ref 4.0–10.5)

## 2016-08-18 LAB — GLUCOSE, CAPILLARY
Glucose-Capillary: 87 mg/dL (ref 65–99)
Glucose-Capillary: 90 mg/dL (ref 65–99)

## 2016-08-18 MED ORDER — TRAMADOL HCL 50 MG PO TABS: 50.0000 mg | ORAL_TABLET | Freq: Four times a day (QID) | ORAL | Status: DC | PRN

## 2016-08-18 MED ORDER — OXYCODONE HCL 5 MG PO TABS
5.0000 mg | ORAL_TABLET | ORAL | Status: DC | PRN
  Administered 2016-08-18: 10 mg via ORAL
  Filled 2016-08-18: qty 2

## 2016-08-18 MED ORDER — OXYCODONE HCL 5 MG PO TABS: 5.0000 mg | ORAL_TABLET | ORAL | 0 refills | Status: DC | PRN

## 2016-08-18 NOTE — Discharge Instructions (Signed)
Bowel regimen while taking pain meds-- stool softeners, miralax as needed  Please arrive at least 30 min before your appointment to complete your check in paperwork.  If you are unable to arrive 30 min prior to your appointment time we may have to cancel or reschedule you.  LAPAROSCOPIC SURGERY: POST OP INSTRUCTIONS  1. DIET: Follow a light bland diet the first 24 hours after arrival home, such as soup, liquids, crackers, etc. Be sure to include lots of fluids daily. Avoid fast food or heavy meals as your are more likely to get nauseated. Eat a low fat the next few days after surgery.  2. Take your usually prescribed home medications unless otherwise directed. 3. PAIN CONTROL:  1. Pain is best controlled by a usual combination of three different methods TOGETHER:  1. Ice/Heat 2. Over the counter pain medication 3. Prescription pain medication 2. Most patients will experience some swelling and bruising around the incisions. Ice packs or heating pads (30-60 minutes up to 6 times a day) will help. Use ice for the first few days to help decrease swelling and bruising, then switch to heat to help relax tight/sore spots and speed recovery. Some people prefer to use ice alone, heat alone, alternating between ice & heat. Experiment to what works for you. Swelling and bruising can take several weeks to resolve.  3. It is helpful to take an over-the-counter pain medication regularly for the first few weeks. Choose one of the following that works best for you:  1. Naproxen (Aleve, etc) Two 220mg  tabs twice a day 2. Ibuprofen (Advil, etc) Three 200mg  tabs four times a day (every meal & bedtime) 3. Acetaminophen (Tylenol, etc) 500-650mg  four times a day (every meal & bedtime) 4. A prescription for pain medication (such as oxycodone, hydrocodone, etc) should be given to you upon discharge. Take your pain medication as prescribed.  1. If you are having problems/concerns with the prescription medicine (does not  control pain, nausea, vomiting, rash, itching, etc), please call us [phone] to see if we need to switch you to a different pain medicine that will work better for you and/or control your side effect better. 2. If you need a refill on your pain medication, please contact your pharmacy. They will contact our office to request authorization. Prescriptions will not be filled after 5 pm or on week-ends. 4. Avoid getting constipated. Between the surgery and the pain medications, it is common to experience some constipation. Increasing fluid intake and taking a fiber supplement (such as Metamucil, Citrucel, FiberCon, MiraLax, etc) 1-2 times a day regularly will usually help prevent this problem from occurring. A mild laxative (prune juice, Milk of Magnesia, MiraLax, etc) should be taken according to package directions if there are no bowel movements after 48 hours.  5. Watch out for diarrhea. If you have many loose bowel movements, simplify your diet to bland foods & liquids for a few days. Stop any stool softeners and decrease your fiber supplement. Switching to mild anti-diarrheal medications (Kayopectate, Pepto Bismol) can help. If this worsens or does not improve, please call us. 6. Wash / shower every day. You may shower over the dressings as they are waterproof. Continue to shower over incision(s) after the dressing is off. If there is glue over the incisions try not to pick it off, let it fall off naturally. 7. Remove your waterproof bandages 2 days after surgery. You may leave the incision open to air. You may replace a dressing/Band-Aid to cover the  incision for comfort if you wish.  8. ACTIVITIES as tolerated:  1. You may resume regular (light) daily activities beginning the next day--such as daily self-care, walking, climbing stairs--gradually increasing activities as tolerated. If you can walk 30 minutes without difficulty, it is safe to try more intense activity such as jogging, treadmill,  bicycling, low-impact aerobics, swimming, etc. 2. Save the most intensive and strenuous activity for last such as sit-ups, heavy lifting, contact sports, etc Refrain from any heavy lifting or straining until you are off narcotics for pain control. For the first 2-3 weeks do not lift over 10-15lb.  3. DO NOT PUSH THROUGH PAIN. Let pain be your guide: If it hurts to do something, don't do it. Pain is your body warning you to avoid that activity for another week until the pain goes down. 4. You may drive when you are no longer taking prescription pain medication, you can comfortably wear a seatbelt, and you can safely maneuver your car and apply brakes. 5. You may have sexual intercourse when it is comfortable.  9. FOLLOW UP in our office  1. Please call [facility] at [phone] to set up an appointment to see your surgeon in the office for a follow-up appointment approximately 2-3 weeks after your surgery. 2. Make sure that you call for this appointment the day you arrive home to insure a convenient appointment time.      10. IF YOU HAVE DISABILITY OR FAMILY LEAVE FORMS, BRING THEM TO THE               OFFICE FOR PROCESSING.   WHEN TO CALL us [phone]:  1. Poor pain control 2. Reactions / problems with new medications (rash/itching, nausea, etc)  3. Fever over 101.5 F (38.5 C) 4. Inability to urinate 5. Nausea and/or vomiting 6. Worsening swelling or bruising 7. Continued bleeding from incision. 8. Increased pain, redness, or drainage from the incision  The clinic staff is available to answer your questions during regular business hours (8:30am-5pm). Please dont hesitate to call and ask to speak to one of our nurses for clinical concerns.  If you have a medical emergency, go to the nearest emergency room or call 911.  A surgeon from [facility] is always on call at the [facility], [place], [place], [place], [place] [address] ?  MAIN:  [phone] ? TOLL FREE: [phone] ?  FAX [phone]  [email or web address]

## 2016-08-18 NOTE — Discharge Summary (Signed)
Physician Discharge Summary  Victoria Weiss OZH:[id number] DOB: [birth year] DOA: 08/15/2016  PCP: Jonathon Jordan, MD  Admit date: 08/15/2016 Discharge date: 08/18/2016   Recommendations for Outpatient Follow-Up:   1. Follow up with general surgery 2-3 weeks 2. Repeat liver enzymes on day of surgical follow up   Discharge Diagnosis:   Principal Problem:   Acute gallstone pancreatitis Active Problems:   Hyperlipidemia   Depression   Abnormal LFTs   Liver mass   Discharge disposition:  Home.  Discharge Condition: Improved.  Diet recommendation: low fat  Wound care: per general surgery   History of Present Illness:   Victoria Weiss is a 51 y.o. female with medical history significant of depression, endometriosis, hyperlipidemia, IBS who is coming to the emergency department with complaints of abdominal pain since yesterday evening 2100 after she ate lasagna for dinner. She was seeing at the [facility] ED Last Night, treated for pain, but workup was not done due to lack of ultrasound capability last night another facility. She describes the pain as being burning like, deep in her back, radiating around her back and abdomen. She denies fever, chills, nausea, emesis, diarrhea, constipation, melena or hematochezia. She denies dysuria, frequency or gross hematuria. She denies sore throat, cough, chest pain, dyspnea, palpitations, diaphoresis, pitting edema of the lower extremities, PND or orthopnea.   Hospital Course by Problem:   Acute gallstone pancreatitis with + MRCP with retained stone PRN pain meds -bowel regiment while on pain meds MRCP: + Lipase only mildly elevated here but was 241 at [facility] ER on 6/16 GI consult to [facility] GI appreciated  General surgery : s/p lap chole  Abnormal LFTs Secondary to above. MRCP + s/p ERCPand lap chole  Hyperlipidemia LDL 133 Low triglycerides  Depression Continue fluoxetine 10 mg by mouth daily. Continue Wellbutrin XL  300 mg by mouth daily.  Liver mass MRCP shows benign hemangioma- outpatient follow up  Hypokalemia -replete PO    Medical Consultants:    GI  surgery   Discharge Exam:   Vitals:   08/17/16 2041 08/18/16 0529  BP: (!) 97/52 (!) 103/59  Pulse: 66 69  Resp: 16 18  Temp: 98.7 F (37.1 C) 98.4 F (36.9 C)   Vitals:   08/17/16 1335 08/17/16 1408 08/17/16 2041 08/18/16 0529  BP:  (!) 105/52 (!) 97/52 (!) 103/59  Pulse:  76 66 69  Resp:  18 16 18   Temp:  97.8 F (36.6 C) 98.7 F (37.1 C) 98.4 F (36.9 C)  TempSrc:  Oral Oral Oral  SpO2:  100% 97% 98%  Weight: 69.8 kg (153 lb 14.4 oz)     Height: 5' 0.5" (1.537 m)       Gen:  NAD    The results of significant diagnostics from this hospitalization (including imaging, microbiology, ancillary and laboratory) are listed below for reference.     Procedures and Diagnostic Studies:   Mr 3d Recon At Scanner  Result Date: 08/16/2016 CLINICAL DATA:  Intermittent abdominal pain for the past 10 days. Back and abdominal pain and numerous gallstones on ultrasound. EXAM: MRI ABDOMEN WITHOUT AND WITH CONTRAST (INCLUDING MRCP) TECHNIQUE: Multiplanar multisequence MR imaging of the abdomen was performed both before and after the administration of intravenous contrast. Heavily T2-weighted images of the biliary and pancreatic ducts were obtained, and three-dimensional MRCP images were rendered by post processing. CONTRAST:  89mL MULTIHANCE GADOBENATE DIMEGLUMINE 529 MG/ML IV SOLN COMPARISON:  Ultrasound 08/15/2016 FINDINGS: Lower chest: The lung base is are  clear. No worrisome pulmonary lesions, pleural or pericardial effusion. Hepatobiliary: There are small scattered hepatic cysts. The largest cyst is in segment 6 near the right kidney and measures 15 mm. There is also a 3.5 cm lesion in segment 3. This has typical MR imaging features of a benign hepatic hemangioma. Peripheral nodular enhancement with complete filling and on delayed  images and enhancement similar to the blood pool on delayed images. No worrisome hepatic lesions. Myelo old intrahepatic biliary dilatation and mild common bile duct dilatation with maximum measurement of 7 mm. Findings suspicious for a tiny distal common bile duct stone measuring approximately 2.5 mm. This is best seen on coronal sequences, series 3, image 11. It is more difficult to see on the dedicated MRCP images but there does appear to be a reversed meniscus sign. No findings for acute cholecystitis. There are innumerable small gallstones layering dependently in the gallbladder. Pancreas:  No mass, inflammation or ductal dilatation. Spleen:  Normal size.  No focal lesions. Adrenals/Urinary Tract: The adrenal glands and kidneys are unremarkable. Stomach/Bowel: Visualized portions within the abdomen are unremarkable. Vascular/Lymphatic: No pathologically enlarged lymph nodes identified. No abdominal aortic aneurysm demonstrated. Other: No ascites or abdominal wall hernia. Small thoracic disc protrusion noted on the left at T11-12. Musculoskeletal: No significant bony findings. IMPRESSION: 1. Cholelithiasis with numerous small layering gallstones in the gallbladder but no findings for acute cholecystitis. 2. Mild common bile duct dilatation measuring a maximum of 7 mm. Suspect a small, 2.5 mm distal common bile duct stone. 3. Small hepatic cysts and a 3.5 cm left hepatic lobe hemangioma. 4. No other significant abdominal findings. Electronically Signed   By: Marijo Sanes M.D.   On: 08/16/2016 08:45   US Abdomen Limited  Result Date: 08/15/2016 CLINICAL DATA:  Right upper quadrant pain. EXAM: ULTRASOUND ABDOMEN LIMITED RIGHT UPPER QUADRANT COMPARISON:  None. FINDINGS: Gallbladder: The gallbladder is normally dilated. No evidence of gallbladder wall thickening. Gallbladder wall measures maximum diameter of 2 mm. No sonographic Murphy's sign. Numerous small layering mobile gallstones, the largest measuring 4  mm. Common bile duct: Diameter: 7 mm Liver: There is a circumscribed isoechoic to liver parenchyma subcapsular mass in the left lobe of the liver measuring 2.4 x 1.9 x 2.2 cm. Simple appearing right lobe cyst measures 1.6 cm. Normal direction of flow of the main portal vein. IMPRESSION: Numerous gallstones without sonographic evidence of acute cholecystitis. Borderline dilation of the extrahepatic common bile duct. Please correlate clinically. Solid-appearing 2.4 cm left hepatic subcapsular mass. Further evaluation with MRI of the abdomen, liver protocol may be considered. Electronically Signed   By: Fidela Salisbury M.D.   On: 08/15/2016 22:09   Mr Abdomen Mrcp Moise Boring Contast  Result Date: 08/16/2016 CLINICAL DATA:  Intermittent abdominal pain for the past 10 days. Back and abdominal pain and numerous gallstones on ultrasound. EXAM: MRI ABDOMEN WITHOUT AND WITH CONTRAST (INCLUDING MRCP) TECHNIQUE: Multiplanar multisequence MR imaging of the abdomen was performed both before and after the administration of intravenous contrast. Heavily T2-weighted images of the biliary and pancreatic ducts were obtained, and three-dimensional MRCP images were rendered by post processing. CONTRAST:  66mL MULTIHANCE GADOBENATE DIMEGLUMINE 529 MG/ML IV SOLN COMPARISON:  Ultrasound 08/15/2016 FINDINGS: Lower chest: The lung base is are clear. No worrisome pulmonary lesions, pleural or pericardial effusion. Hepatobiliary: There are small scattered hepatic cysts. The largest cyst is in segment 6 near the right kidney and measures 15 mm. There is also a 3.5 cm lesion in segment 3. This  has typical MR imaging features of a benign hepatic hemangioma. Peripheral nodular enhancement with complete filling and on delayed images and enhancement similar to the blood pool on delayed images. No worrisome hepatic lesions. Myelo old intrahepatic biliary dilatation and mild common bile duct dilatation with maximum measurement of 7 mm. Findings  suspicious for a tiny distal common bile duct stone measuring approximately 2.5 mm. This is best seen on coronal sequences, series 3, image 11. It is more difficult to see on the dedicated MRCP images but there does appear to be a reversed meniscus sign. No findings for acute cholecystitis. There are innumerable small gallstones layering dependently in the gallbladder. Pancreas:  No mass, inflammation or ductal dilatation. Spleen:  Normal size.  No focal lesions. Adrenals/Urinary Tract: The adrenal glands and kidneys are unremarkable. Stomach/Bowel: Visualized portions within the abdomen are unremarkable. Vascular/Lymphatic: No pathologically enlarged lymph nodes identified. No abdominal aortic aneurysm demonstrated. Other: No ascites or abdominal wall hernia. Small thoracic disc protrusion noted on the left at T11-12. Musculoskeletal: No significant bony findings. IMPRESSION: 1. Cholelithiasis with numerous small layering gallstones in the gallbladder but no findings for acute cholecystitis. 2. Mild common bile duct dilatation measuring a maximum of 7 mm. Suspect a small, 2.5 mm distal common bile duct stone. 3. Small hepatic cysts and a 3.5 cm left hepatic lobe hemangioma. 4. No other significant abdominal findings. Electronically Signed   By: Marijo Sanes M.D.   On: 08/16/2016 08:45     Labs:   Basic Metabolic Panel:  Recent Labs Lab 08/15/16 1921 08/15/16 2303 08/16/16 0355 08/17/16 0531 08/18/16 0736  NA 137  --  142 140 140  K 3.5  --  4.1 3.4* 4.0  CL 105  --  108 109 109  CO2 23  --  28 23 27   GLUCOSE 129*  --  94 86 97  BUN 9  --  7 <5* <5*  CREATININE 0.91  --  0.82 0.75 0.85  CALCIUM 9.3  --  8.7* 8.5* 8.6*  MG  --  2.3  --   --   --    GFR Estimated Creatinine Clearance: 71.4 mL/min (by C-G formula based on SCr of 0.85 mg/dL). Liver Function Tests:  Recent Labs Lab 08/15/16 1921 08/16/16 0355 08/17/16 0531 08/18/16 0736  AST 599* 590* 261* 170*  ALT 532* 609* 492*  376*  ALKPHOS 155* 168* 194* 167*  BILITOT 4.3* 4.6* 1.9* 1.2  PROT 6.9 6.0* 5.8* 5.6*  ALBUMIN 4.0 3.5 3.3* 3.0*    Recent Labs Lab 08/15/16 1921 08/16/16 0355 08/17/16 0531  LIPASE 55* 41 33   No results for input(s): AMMONIA in the last 168 hours. Coagulation profile No results for input(s): INR, PROTIME in the last 168 hours.  CBC:  Recent Labs Lab 08/15/16 1921 08/16/16 0355 08/17/16 0531 08/18/16 0736  WBC 5.9 5.2 4.9 6.7  HGB 13.4 12.2 11.6* 10.9*  HCT 39.9 37.7 35.8* 34.1*  MCV 92.4 94.0 95.0 96.3  PLT 277 214 197 198   Cardiac Enzymes: No results for input(s): CKTOTAL, CKMB, CKMBINDEX, TROPONINI in the last 168 hours. BNP: Invalid input(s): POCBNP CBG:  Recent Labs Lab 08/17/16 0722 08/17/16 1308 08/17/16 1747 08/18/16 0019 08/18/16 0529  GLUCAP 83 147* 144* 87 90   D-Dimer No results for input(s): DDIMER in the last 72 hours. Hgb A1c No results for input(s): HGBA1C in the last 72 hours. Lipid Profile  Recent Labs  08/16/16 0355  CHOL 220*  HDL 79  LDLCALC 133*  TRIG 39  CHOLHDL 2.8   Thyroid function studies No results for input(s): TSH, T4TOTAL, T3FREE, THYROIDAB in the last 72 hours.  Invalid input(s): FREET3 Anemia work up No results for input(s): VITAMINB12, FOLATE, FERRITIN, TIBC, IRON, RETICCTPCT in the last 72 hours. Microbiology Recent Results (from the past 240 hour(s))  Surgical pcr screen     Status: None   Collection Time: 08/16/16 10:15 PM  Result Value Ref Range Status   MRSA, PCR NEGATIVE NEGATIVE Final   Staphylococcus aureus NEGATIVE NEGATIVE Final    Comment:        The Xpert SA Assay (FDA approved for NASAL specimens in patients over 34 years of age), is one component of a comprehensive surveillance program.  Test performance has been validated by [facility] for patients greater than or equal to 22 year old. It is not intended to diagnose infection nor to guide or monitor treatment.      Discharge  Instructions:   Discharge Instructions    Discharge instructions    Complete by:  As directed    See instructions from surgery regarding post op care May return to work on Monday if feeling able, if not remain out for 7 days post-op (June 27th, 2018) Soft bland diet   Driving Restrictions    Complete by:  As directed    No driving while taking pain meds   Increase activity slowly    Complete by:  As directed      Allergies as of 08/18/2016      Reactions   No Known Allergies       Medication List    STOP taking these medications   oxyCODONE-acetaminophen 5-325 MG tablet Commonly known as:  PERCOCET/ROXICET     TAKE these medications   buPROPion 300 MG 24 hr tablet Commonly known as:  WELLBUTRIN XL Take 300 mg by mouth daily.   FLUoxetine 10 MG capsule Commonly known as:  PROZAC Take 10 mg by mouth daily.   lamoTRIgine 100 MG tablet Commonly known as:  LAMICTAL Take 100 mg by mouth daily.   oxyCODONE 5 MG immediate release tablet Commonly known as:  Oxy IR/ROXICODONE Take 1-2 tablets (5-10 mg total) by mouth every 4 (four) hours as needed for moderate pain.   traMADol 50 MG tablet Commonly known as:  ULTRAM Take 50 mg by mouth every 6 (six) hours as needed (pain).      Follow-up Information    Jonathon Jordan, MD Follow up in 1 week(s).   Specialty:  Family Medicine Contact information: [address] [place] [address] [phone]            Time coordinating discharge: 35 min  Signed:  Noah Lembke Alison Stalling   [facility] 08/18/2016, 9:14 AM

## 2016-08-18 NOTE — Progress Notes (Signed)
Niel Hummer 9:27 AM  Subjective: Patient doing well today tolerating diet no obvious complications no new complaints probably able to go home await surgical opinion  Objective: Vital signs stable afebrile patient looks well not examined today liver tests improved white count okay  Assessment: Status post ERCP and laparoscopic cholecystectomy  Plan: Okay with me to go home today but await surgical opinion and repeat liver tests in my office on the day of surgical follow-up and I'm happy to see back when necessary  [facility]  Pager [phone] After 5PM or if no answer call [phone]

## 2016-08-18 NOTE — Care Management Note (Signed)
Case Management Note  Patient Details  Name: Victoria Weiss MRN: [id number] Date of Birth: [birth year]  Subjective/Objective:                    Action/Plan:  No needs identified Expected Discharge Date:  08/18/16               Expected Discharge Plan:  Home/Self Care  In-House Referral:     Discharge planning Services     Post Acute Care Choice:    Choice offered to:     DME Arranged:    DME Agency:     HH Arranged:    [place] Agency:     Status of Service:  Completed, signed off  If discussed at [employer] of Stay Meetings, dates discussed:    Additional Comments:  Marilu Favre, RN 08/18/2016, 11:07 AM

## 2016-08-18 NOTE — Progress Notes (Signed)
Patient ID: Victoria Weiss, female   DOB: 1965-09-04, 51 y.o.   MRN: [id number]  [facility] Surgery Progress Note  1 Day Post-Op  Subjective: CC- choledocholithiasis Sitting up in bed this morning. States that she is sore, but pain is different than prior to surgery. She is tolerating a soft diet. Denies n/v. Ambulating with no issues.  Objective: Vital signs in last 24 hours: Temp:  [97.3 F (36.3 C)-98.7 F (37.1 C)] 98.4 F (36.9 C) (06/20 0529) Pulse Rate:  [66-77] 69 (06/20 0529) Resp:  [16-22] 18 (06/20 0529) BP: (97-113)/(52-63) 103/59 (06/20 0529) SpO2:  [95 %-100 %] 98 % (06/20 0529) Weight:  [153 lb 14.4 oz (69.8 kg)] 153 lb 14.4 oz (69.8 kg) (06/19 1335) Last BM Date: 08/16/16  Intake/Output from previous day: 06/19 0701 - 06/20 0700 In: 1900 [P.O.:600; I.V.:1300] Out: 10 [Blood:10] Intake/Output this shift: No intake/output data recorded.  PE: Gen:  Alert, NAD, pleasant HEENT: EOM's intact, pupils equal  Pulm:  CTAB, no W/R/R, effort normal Abd: Soft, ND, appropriately tender, +BS, incisions covered with clean/dry dressings, most proximal dressing with scant dry blood Psych: A&Ox3   Lab Results:   Recent Labs  08/17/16 0531 08/18/16 0736  WBC 4.9 6.7  HGB 11.6* 10.9*  HCT 35.8* 34.1*  PLT 197 198   BMET  Recent Labs  08/17/16 0531 08/18/16 0736  NA 140 140  K 3.4* 4.0  CL 109 109  CO2 23 27  GLUCOSE 86 97  BUN <5* <5*  CREATININE 0.75 0.85  CALCIUM 8.5* 8.6*   PT/INR No results for input(s): LABPROT, INR in the last 72 hours. CMP     Component Value Date/Time   NA 140 08/18/2016 0736   K 4.0 08/18/2016 0736   CL 109 08/18/2016 0736   CO2 27 08/18/2016 0736   GLUCOSE 97 08/18/2016 0736   BUN <5 (L) 08/18/2016 0736   CREATININE 0.85 08/18/2016 0736   CALCIUM 8.6 (L) 08/18/2016 0736   PROT 5.6 (L) 08/18/2016 0736   ALBUMIN 3.0 (L) 08/18/2016 0736   AST 170 (H) 08/18/2016 0736   ALT 376 (H) 08/18/2016 0736   ALKPHOS 167 (H)  08/18/2016 0736   BILITOT 1.2 08/18/2016 0736   GFRNONAA >60 08/18/2016 0736   GFRAA >60 08/18/2016 0736   Lipase     Component Value Date/Time   LIPASE 33 08/17/2016 0531       Studies/Results: Dg Ercp Biliary & Pancreatic Ducts  Result Date: 08/17/2016 CLINICAL DATA:  Biliary stones. EXAM: ERCP TECHNIQUE: Multiple spot images obtained with the fluoroscopic device and submitted for interpretation post-procedure. FLUOROSCOPY TIME:  Fluoroscopy Time:  3 minutes and 11 seconds Radiation Exposure Index (if provided by the fluoroscopic device): Number of Acquired Spot Images: 5 COMPARISON:  None. FINDINGS: Images demonstrate cannulation of the common bile duct and a cholangiogram. Multiple filling defects are present in the common bile duct. Balloon stone retrieval is documented. Final imaging demonstrates resolution of the filling defects. IMPRESSION: Successful common bile duct balloon stone retrieval. These images were submitted for radiologic interpretation only. Please see the procedural report for the amount of contrast and the fluoroscopy time utilized. Electronically Signed   By: Marybelle Killings M.D.   On: 08/17/2016 10:35    Anti-infectives: Anti-infectives    Start     Dose/Rate Route Frequency Ordered Stop   08/17/16 0600  ciprofloxacin (CIPRO) IVPB 400 mg     400 mg 200 mL/hr over 60 Minutes Intravenous To Surgery 08/16/16 1518  08/17/16 1019       Assessment/Plan Depression HLD IBS  Calculus of gallbladder without mention of cholecystitis, with obstruction - s/p lap chole and ERCP 6/19 Dr. Georgette Dover and Dr. Watt Climes - bilirubin WNL today, transaminases trending down  ID - cipro perioperative VTE - SCDs FEN - soft diet  Plan - ready for discharge from surgical standpoint. Patient will follow-up in 2-3 weeks in our [place] clinic. She does not need antibiotics.   LOS: 3 days    Jerrye Beavers , [facility] Surgery 08/18/2016, 9:19 AM Pager:  [phone] Consults: [phone] Mon-Fri 7:00 am-4:30 pm Sat-Sun 7:00 am-11:30 am

## 2016-08-18 NOTE — Progress Notes (Signed)
Discharge instructions, RXs and follow up appts explained and provided to the patient verbalized understanding. Patient left floor via wheelchair accompanied by staff no c/o pain or shortness of breath at d/c.  [name], Tivis Ringer, RN

## 2016-08-18 NOTE — Progress Notes (Signed)
Patient oob bed ambulating in halls this am.  No complaints when pain is controlled.

## 2016-09-02 DIAGNOSIS — E559 Vitamin D deficiency, unspecified: Secondary | ICD-10-CM | POA: Diagnosis not present

## 2016-09-02 DIAGNOSIS — R945 Abnormal results of liver function studies: Secondary | ICD-10-CM | POA: Diagnosis not present

## 2016-09-09 DIAGNOSIS — J0101 Acute recurrent maxillary sinusitis: Secondary | ICD-10-CM | POA: Diagnosis not present

## 2016-09-20 DIAGNOSIS — Z01419 Encounter for gynecological examination (general) (routine) without abnormal findings: Secondary | ICD-10-CM | POA: Diagnosis not present

## 2016-09-20 DIAGNOSIS — Z1231 Encounter for screening mammogram for malignant neoplasm of breast: Secondary | ICD-10-CM | POA: Diagnosis not present

## 2016-09-20 DIAGNOSIS — Z6823 Body mass index (BMI) 23.0-23.9, adult: Secondary | ICD-10-CM | POA: Diagnosis not present

## 2016-10-12 DIAGNOSIS — F341 Dysthymic disorder: Secondary | ICD-10-CM | POA: Diagnosis not present

## 2016-10-12 DIAGNOSIS — F331 Major depressive disorder, recurrent, moderate: Secondary | ICD-10-CM | POA: Diagnosis not present

## 2016-10-12 DIAGNOSIS — F418 Other specified anxiety disorders: Secondary | ICD-10-CM | POA: Diagnosis not present

## 2016-10-12 DIAGNOSIS — F900 Attention-deficit hyperactivity disorder, predominantly inattentive type: Secondary | ICD-10-CM | POA: Diagnosis not present

## 2016-11-10 DIAGNOSIS — R42 Dizziness and giddiness: Secondary | ICD-10-CM | POA: Diagnosis not present

## 2016-12-12 DIAGNOSIS — Z23 Encounter for immunization: Secondary | ICD-10-CM | POA: Diagnosis not present

## 2016-12-14 DIAGNOSIS — M5126 Other intervertebral disc displacement, lumbar region: Secondary | ICD-10-CM | POA: Diagnosis not present

## 2016-12-14 DIAGNOSIS — Z6823 Body mass index (BMI) 23.0-23.9, adult: Secondary | ICD-10-CM | POA: Diagnosis not present

## 2016-12-14 DIAGNOSIS — M4716 Other spondylosis with myelopathy, lumbar region: Secondary | ICD-10-CM | POA: Diagnosis not present

## 2016-12-16 DIAGNOSIS — S46211A Strain of muscle, fascia and tendon of other parts of biceps, right arm, initial encounter: Secondary | ICD-10-CM | POA: Diagnosis not present

## 2016-12-23 DIAGNOSIS — M25521 Pain in right elbow: Secondary | ICD-10-CM | POA: Diagnosis not present

## 2016-12-23 DIAGNOSIS — S46211D Strain of muscle, fascia and tendon of other parts of biceps, right arm, subsequent encounter: Secondary | ICD-10-CM | POA: Diagnosis not present

## 2016-12-23 DIAGNOSIS — R531 Weakness: Secondary | ICD-10-CM | POA: Diagnosis not present

## 2016-12-30 DIAGNOSIS — S46211D Strain of muscle, fascia and tendon of other parts of biceps, right arm, subsequent encounter: Secondary | ICD-10-CM | POA: Diagnosis not present

## 2016-12-30 DIAGNOSIS — M25521 Pain in right elbow: Secondary | ICD-10-CM | POA: Diagnosis not present

## 2016-12-30 DIAGNOSIS — R531 Weakness: Secondary | ICD-10-CM | POA: Diagnosis not present

## 2017-01-06 DIAGNOSIS — S46211A Strain of muscle, fascia and tendon of other parts of biceps, right arm, initial encounter: Secondary | ICD-10-CM | POA: Diagnosis not present

## 2017-01-12 DIAGNOSIS — R531 Weakness: Secondary | ICD-10-CM | POA: Diagnosis not present

## 2017-01-12 DIAGNOSIS — M25521 Pain in right elbow: Secondary | ICD-10-CM | POA: Diagnosis not present

## 2017-01-12 DIAGNOSIS — S46211D Strain of muscle, fascia and tendon of other parts of biceps, right arm, subsequent encounter: Secondary | ICD-10-CM | POA: Diagnosis not present

## 2017-02-17 DIAGNOSIS — M25521 Pain in right elbow: Secondary | ICD-10-CM | POA: Diagnosis not present

## 2017-03-16 DIAGNOSIS — H16102 Unspecified superficial keratitis, left eye: Secondary | ICD-10-CM | POA: Diagnosis not present

## 2017-03-20 DIAGNOSIS — J0100 Acute maxillary sinusitis, unspecified: Secondary | ICD-10-CM | POA: Diagnosis not present

## 2017-04-03 DIAGNOSIS — H6691 Otitis media, unspecified, right ear: Secondary | ICD-10-CM | POA: Diagnosis not present

## 2017-04-03 DIAGNOSIS — J069 Acute upper respiratory infection, unspecified: Secondary | ICD-10-CM | POA: Diagnosis not present

## 2017-04-14 DIAGNOSIS — F341 Dysthymic disorder: Secondary | ICD-10-CM | POA: Diagnosis not present

## 2017-04-14 DIAGNOSIS — F331 Major depressive disorder, recurrent, moderate: Secondary | ICD-10-CM | POA: Diagnosis not present

## 2017-04-14 DIAGNOSIS — F418 Other specified anxiety disorders: Secondary | ICD-10-CM | POA: Diagnosis not present

## 2017-04-14 DIAGNOSIS — F900 Attention-deficit hyperactivity disorder, predominantly inattentive type: Secondary | ICD-10-CM | POA: Diagnosis not present

## 2017-04-19 DIAGNOSIS — Z6823 Body mass index (BMI) 23.0-23.9, adult: Secondary | ICD-10-CM | POA: Diagnosis not present

## 2017-04-19 DIAGNOSIS — M4716 Other spondylosis with myelopathy, lumbar region: Secondary | ICD-10-CM | POA: Diagnosis not present

## 2017-04-19 DIAGNOSIS — M5126 Other intervertebral disc displacement, lumbar region: Secondary | ICD-10-CM | POA: Diagnosis not present

## 2017-04-20 DIAGNOSIS — F900 Attention-deficit hyperactivity disorder, predominantly inattentive type: Secondary | ICD-10-CM | POA: Diagnosis not present

## 2017-04-20 DIAGNOSIS — F418 Other specified anxiety disorders: Secondary | ICD-10-CM | POA: Diagnosis not present

## 2017-04-20 DIAGNOSIS — F331 Major depressive disorder, recurrent, moderate: Secondary | ICD-10-CM | POA: Diagnosis not present

## 2017-05-12 DIAGNOSIS — F331 Major depressive disorder, recurrent, moderate: Secondary | ICD-10-CM | POA: Diagnosis not present

## 2017-05-12 DIAGNOSIS — F341 Dysthymic disorder: Secondary | ICD-10-CM | POA: Diagnosis not present

## 2017-05-12 DIAGNOSIS — F900 Attention-deficit hyperactivity disorder, predominantly inattentive type: Secondary | ICD-10-CM | POA: Diagnosis not present

## 2017-05-19 DIAGNOSIS — F418 Other specified anxiety disorders: Secondary | ICD-10-CM | POA: Diagnosis not present

## 2017-05-19 DIAGNOSIS — F331 Major depressive disorder, recurrent, moderate: Secondary | ICD-10-CM | POA: Diagnosis not present

## 2017-05-19 DIAGNOSIS — F900 Attention-deficit hyperactivity disorder, predominantly inattentive type: Secondary | ICD-10-CM | POA: Diagnosis not present

## 2017-05-19 DIAGNOSIS — F341 Dysthymic disorder: Secondary | ICD-10-CM | POA: Diagnosis not present

## 2017-06-15 DIAGNOSIS — Z79899 Other long term (current) drug therapy: Secondary | ICD-10-CM | POA: Diagnosis not present

## 2017-06-15 DIAGNOSIS — Z0000 Encounter for general adult medical examination without abnormal findings: Secondary | ICD-10-CM | POA: Diagnosis not present

## 2017-06-15 DIAGNOSIS — E785 Hyperlipidemia, unspecified: Secondary | ICD-10-CM | POA: Diagnosis not present

## 2017-06-15 DIAGNOSIS — E559 Vitamin D deficiency, unspecified: Secondary | ICD-10-CM | POA: Diagnosis not present

## 2017-07-06 DIAGNOSIS — J209 Acute bronchitis, unspecified: Secondary | ICD-10-CM | POA: Diagnosis not present

## 2017-07-27 DIAGNOSIS — J22 Unspecified acute lower respiratory infection: Secondary | ICD-10-CM | POA: Diagnosis not present

## 2017-07-27 DIAGNOSIS — R05 Cough: Secondary | ICD-10-CM | POA: Diagnosis not present

## 2017-08-04 DIAGNOSIS — S83242A Other tear of medial meniscus, current injury, left knee, initial encounter: Secondary | ICD-10-CM | POA: Diagnosis not present

## 2017-08-17 DIAGNOSIS — F900 Attention-deficit hyperactivity disorder, predominantly inattentive type: Secondary | ICD-10-CM | POA: Diagnosis not present

## 2017-08-17 DIAGNOSIS — F331 Major depressive disorder, recurrent, moderate: Secondary | ICD-10-CM | POA: Diagnosis not present

## 2017-08-17 DIAGNOSIS — F418 Other specified anxiety disorders: Secondary | ICD-10-CM | POA: Diagnosis not present

## 2017-08-17 DIAGNOSIS — F341 Dysthymic disorder: Secondary | ICD-10-CM | POA: Diagnosis not present

## 2017-09-27 DIAGNOSIS — Z01419 Encounter for gynecological examination (general) (routine) without abnormal findings: Secondary | ICD-10-CM | POA: Diagnosis not present

## 2017-09-27 DIAGNOSIS — Z800 Family history of malignant neoplasm of digestive organs: Secondary | ICD-10-CM | POA: Diagnosis not present

## 2017-09-27 DIAGNOSIS — Z803 Family history of malignant neoplasm of breast: Secondary | ICD-10-CM | POA: Diagnosis not present

## 2017-09-27 DIAGNOSIS — Z808 Family history of malignant neoplasm of other organs or systems: Secondary | ICD-10-CM | POA: Diagnosis not present

## 2017-09-27 DIAGNOSIS — Z8052 Family history of malignant neoplasm of bladder: Secondary | ICD-10-CM | POA: Diagnosis not present

## 2017-09-27 DIAGNOSIS — Z1231 Encounter for screening mammogram for malignant neoplasm of breast: Secondary | ICD-10-CM | POA: Diagnosis not present

## 2017-09-27 DIAGNOSIS — Z6822 Body mass index (BMI) 22.0-22.9, adult: Secondary | ICD-10-CM | POA: Diagnosis not present

## 2017-09-27 DIAGNOSIS — Z13820 Encounter for screening for osteoporosis: Secondary | ICD-10-CM | POA: Diagnosis not present

## 2017-10-03 DIAGNOSIS — J019 Acute sinusitis, unspecified: Secondary | ICD-10-CM | POA: Diagnosis not present

## 2017-10-03 DIAGNOSIS — R05 Cough: Secondary | ICD-10-CM | POA: Diagnosis not present

## 2017-10-18 DIAGNOSIS — M4716 Other spondylosis with myelopathy, lumbar region: Secondary | ICD-10-CM | POA: Diagnosis not present

## 2017-10-18 DIAGNOSIS — M5126 Other intervertebral disc displacement, lumbar region: Secondary | ICD-10-CM | POA: Diagnosis not present

## 2017-10-26 ENCOUNTER — Ambulatory Visit (INDEPENDENT_AMBULATORY_CARE_PROVIDER_SITE_OTHER): Payer: BLUE CROSS/BLUE SHIELD

## 2017-10-26 ENCOUNTER — Encounter: Payer: Self-pay | Admitting: Podiatry

## 2017-10-26 ENCOUNTER — Other Ambulatory Visit: Payer: Self-pay | Admitting: Podiatry

## 2017-10-26 ENCOUNTER — Ambulatory Visit
Admission: RE | Admit: 2017-10-26 | Discharge: 2017-10-26 | Disposition: A | Discharge status: Home / Self Care | Payer: BLUE CROSS/BLUE SHIELD | Source: Ambulatory Visit | Attending: Family Medicine | Admitting: Family Medicine

## 2017-10-26 ENCOUNTER — Ambulatory Visit (INDEPENDENT_AMBULATORY_CARE_PROVIDER_SITE_OTHER): Payer: BLUE CROSS/BLUE SHIELD | Admitting: Podiatry

## 2017-10-26 ENCOUNTER — Other Ambulatory Visit: Payer: Self-pay | Admitting: Family Medicine

## 2017-10-26 DIAGNOSIS — M779 Enthesopathy, unspecified: Secondary | ICD-10-CM | POA: Diagnosis not present

## 2017-10-26 DIAGNOSIS — M7752 Other enthesopathy of left foot: Secondary | ICD-10-CM | POA: Diagnosis not present

## 2017-10-26 DIAGNOSIS — M5116 Intervertebral disc disorders with radiculopathy, lumbar region: Secondary | ICD-10-CM | POA: Insufficient documentation

## 2017-10-26 DIAGNOSIS — S99922A Unspecified injury of left foot, initial encounter: Secondary | ICD-10-CM

## 2017-10-26 DIAGNOSIS — M542 Cervicalgia: Secondary | ICD-10-CM

## 2017-10-26 DIAGNOSIS — R51 Headache: Secondary | ICD-10-CM | POA: Diagnosis not present

## 2017-10-26 DIAGNOSIS — M79672 Pain in left foot: Secondary | ICD-10-CM | POA: Diagnosis not present

## 2017-10-26 DIAGNOSIS — M47817 Spondylosis without myelopathy or radiculopathy, lumbosacral region: Secondary | ICD-10-CM | POA: Insufficient documentation

## 2017-10-26 MED ORDER — TRIAMCINOLONE ACETONIDE 10 MG/ML IJ SUSP
10.0000 mg | Freq: Once | INTRAMUSCULAR | Status: AC
  Administered 2017-10-26: 10 mg

## 2017-10-26 NOTE — Progress Notes (Signed)
   Subjective:    Patient ID: Victoria Weiss, female    DOB: 1965-12-17, 52 y.o.   MRN: [id number]  HPI    Review of Systems  Neurological: Positive for headaches.  All other systems reviewed and are negative.      Objective:   Physical Exam        Assessment & Plan:

## 2017-10-28 NOTE — Progress Notes (Signed)
Subjective:   Patient ID: Victoria Weiss, female   DOB: 52 y.o.   MRN: [id number]   HPI Patient presents stating that she twisted her left ankle several months ago and her ankle has remained sore deep inside the joint and she does not feel instability but does still pending.  Patient does not smoke and likes to be active   Review of Systems  All other systems reviewed and are negative.       Objective:  Physical Exam  Constitutional: She appears well-developed and well-nourished.  Cardiovascular: Intact distal pulses.  Pulmonary/Chest: Effort normal.  Musculoskeletal: Normal range of motion.  Neurological: She is alert.  Skin: Skin is warm.  Nursing note and vitals reviewed.   Neurovascular status found to be intact muscle strength is adequate range of motion within normal limits with patient noted to have exquisite discomfort sinus tarsi left but I did not note any instability of the ankle when tested.  Patient has good digital perfusion well oriented x3     Assessment:  Sinus tarsitis left with inflammation fluid of the joint probably due to the injury sustained     Plan:  H&P condition reviewed and today I did sinus tarsi injection left 3 mg Kenalog 5 mg Xylocaine applied fascial brace to stabilize the ankle and instructed on range of motion exercises.  Reappoint as needed  X-rays were negative for signs of bony injury from the previous sprain and no indications of arthritis

## 2017-11-03 ENCOUNTER — Other Ambulatory Visit: Payer: Self-pay | Admitting: Neurosurgery

## 2017-11-03 DIAGNOSIS — M4310 Spondylolisthesis, site unspecified: Secondary | ICD-10-CM

## 2017-11-07 DIAGNOSIS — Z809 Family history of malignant neoplasm, unspecified: Secondary | ICD-10-CM | POA: Diagnosis not present

## 2017-11-08 ENCOUNTER — Ambulatory Visit
Admission: RE | Admit: 2017-11-08 | Discharge: 2017-11-08 | Disposition: A | Discharge status: Home / Self Care | Payer: BLUE CROSS/BLUE SHIELD | Source: Ambulatory Visit | Attending: Neurosurgery | Admitting: Neurosurgery

## 2017-11-08 DIAGNOSIS — R51 Headache: Secondary | ICD-10-CM | POA: Diagnosis not present

## 2017-11-08 DIAGNOSIS — M4310 Spondylolisthesis, site unspecified: Secondary | ICD-10-CM

## 2017-11-09 ENCOUNTER — Other Ambulatory Visit: Payer: Self-pay | Admitting: Obstetrics and Gynecology

## 2017-11-09 DIAGNOSIS — Z803 Family history of malignant neoplasm of breast: Secondary | ICD-10-CM

## 2017-11-16 DIAGNOSIS — F341 Dysthymic disorder: Secondary | ICD-10-CM | POA: Diagnosis not present

## 2017-11-16 DIAGNOSIS — F900 Attention-deficit hyperactivity disorder, predominantly inattentive type: Secondary | ICD-10-CM | POA: Diagnosis not present

## 2017-11-16 DIAGNOSIS — F39 Unspecified mood [affective] disorder: Secondary | ICD-10-CM | POA: Diagnosis not present

## 2017-11-16 DIAGNOSIS — F331 Major depressive disorder, recurrent, moderate: Secondary | ICD-10-CM | POA: Diagnosis not present

## 2017-11-17 ENCOUNTER — Encounter: Payer: Self-pay | Admitting: Oncology

## 2017-11-17 ENCOUNTER — Telehealth: Payer: Self-pay | Admitting: Oncology

## 2017-11-17 NOTE — Telephone Encounter (Signed)
Pt has been scheduled for the high risk breast clinic to see Dr. Jana Hakim on 10/16 at 4pm. Pt aware to arrive 15 minutes early. Letter mailed.

## 2017-11-21 ENCOUNTER — Ambulatory Visit
Admission: RE | Admit: 2017-11-21 | Discharge: 2017-11-21 | Disposition: A | Discharge status: Home / Self Care | Payer: BLUE CROSS/BLUE SHIELD | Source: Ambulatory Visit | Attending: Obstetrics and Gynecology | Admitting: Obstetrics and Gynecology

## 2017-11-21 DIAGNOSIS — Z803 Family history of malignant neoplasm of breast: Secondary | ICD-10-CM

## 2017-11-21 MED ORDER — GADOBENATE DIMEGLUMINE 529 MG/ML IV SOLN
13.0000 mL | Freq: Once | INTRAVENOUS | Status: AC | PRN
  Administered 2017-11-21: 13 mL via INTRAVENOUS

## 2017-11-22 ENCOUNTER — Other Ambulatory Visit: Payer: Self-pay | Admitting: Obstetrics and Gynecology

## 2017-11-22 DIAGNOSIS — Z803 Family history of malignant neoplasm of breast: Secondary | ICD-10-CM

## 2017-11-27 ENCOUNTER — Ambulatory Visit
Admission: RE | Admit: 2017-11-27 | Discharge: 2017-11-27 | Disposition: A | Discharge status: Home / Self Care | Payer: BLUE CROSS/BLUE SHIELD | Source: Ambulatory Visit | Attending: Obstetrics and Gynecology | Admitting: Obstetrics and Gynecology

## 2017-11-27 DIAGNOSIS — Z803 Family history of malignant neoplasm of breast: Secondary | ICD-10-CM

## 2017-12-05 DIAGNOSIS — H6503 Acute serous otitis media, bilateral: Secondary | ICD-10-CM | POA: Diagnosis not present

## 2017-12-05 DIAGNOSIS — J209 Acute bronchitis, unspecified: Secondary | ICD-10-CM | POA: Diagnosis not present

## 2017-12-08 DIAGNOSIS — Z1589 Genetic susceptibility to other disease: Secondary | ICD-10-CM | POA: Diagnosis not present

## 2017-12-13 NOTE — Progress Notes (Signed)
[facility]  [phone] [phone]     ID: Victoria Weiss DOB: 12-23-1965  MR#: [id number]  HDQ#:[id number]  Patient Care Team: Victoria Jordan, MD as PCP - General (Family Medicine) Victoria Essex, MD as Consulting Physician (Gastroenterology) Victoria Mesa, MD as Consulting Physician (General Surgery) Victoria Weiss, Victoria Dad, MD as Consulting Physician (Oncology) Victoria Farrier, MD as Consulting Physician (Obstetrics and Gynecology) Victoria Farrier, MD as Consulting Physician (Obstetrics and Gynecology) Victoria Fellows, MD as Attending Physician (Neurosurgery) Victoria Grippe, MD as Referring Physician (Psychiatry) OTHER MD:    CHIEF COMPLAINT: deleterous ATM mutation  CURRENT TREATMENT: intensified screening   HISTORY OF CURRENT ILLNESS: [name] has a significant family history of cancer. She had genetic testing through Victoria Weiss office completed 09/27/2017:  the [facility] myRisk Hereditary gene panel. 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   There were no deleterious mutations in APC, AXIN2, BARD1, BMPR1A, BRCA1, BRCA2, BRIP1, CDH1, CDK4, CDKN2A, CHEK2, EPCAM (large rearrangement only), HOXB13 (sequencing only), GALNT12, MLH1, MSH2, MSH3 (excluding repetitive portions of exon 1), MSH6, MUTYH, NBN, NTHL1, PALB2, PMS2, PTEN, RAD51, RAD51D, RNF43, RPS20, SMAD4, STK11, TP53, Sequencing was performed for select regions of POLE and POLD1, and large rearrangement analysis was performed for selection regions of GREM1.   Women found with an ATM mutation have an increased risk in breast cancer as well as an elevated risk of pancreatic cancer and possibly ovarian cancer.   She had bilateral breast MRI on 11/21/2017 showing: breast density category B. There was no evidence of malignancy in either breast.   In addition, the patient underwent ERCP on 08/17/2016 under Dr. Watt Weiss with removal of a common bile duct stone.  The  pancreatic duct was not injected.  The patient's subsequent history is as detailed below.  INTERVAL HISTORY: Victoria Weiss was evaluated in the high risk cancer clinic on 12/14/2017.    REVIEW OF SYSTEMS: For exercise, she walks 2-3 times per week on the treadmill and she walks her dogs on the weekends. She denies unusual headaches, visual changes, nausea, vomiting, or dizziness. There has been no unusual cough, phlegm production, or pleurisy. There has been no change in bowel or bladder habits. She denies unexplained fatigue or unexplained weight loss, bleeding, rash, or fever. A detailed review of systems was otherwise stable.    PAST MEDICAL HISTORY: Past Medical History:  Diagnosis Date  . Depression   . Endometriosis   . Family history of adverse reaction to anesthesia    " My MOM HAD PROBLEMS ,i DONTKNOW EXACTLY WHAT "  . Gallstones 07/2016  . Hyperlipidemia   . IBS (irritable bowel syndrome)     PAST SURGICAL HISTORY: Past Surgical History:  Procedure Laterality Date  . BACK SURGERY    . CHOLECYSTECTOMY  08/17/2016  . CHOLECYSTECTOMY N/A 08/17/2016   Procedure: LAPAROSCOPIC CHOLECYSTECTOMY WITH INTRAOPERATIVE CHOLANGIOGRAM;  Surgeon: Victoria Mesa, MD;  Location: [facility];  Service: General;  Laterality: N/A;  . ECTOPIC PREGNANCY SURGERY    . ERCP N/A 08/17/2016   Procedure: ENDOSCOPIC RETROGRADE CHOLANGIOPANCREATOGRAPHY (ERCP);  Surgeon: Victoria Essex, MD;  Location: [facility];  Service: Endoscopy;  Laterality: N/A;  . LAPAROSCOPY    . SHOULDER SURGERY Bilateral   . TUBAL LIGATION    Appendectomy   FAMILY HISTORY Family History  Problem Relation Age of Onset  . Prostate cancer Father        Living, 44  . Bladder Cancer Mother  Living, 71  . Hypertension Mother   . Aneurysm Mother        brain  . Colon cancer Maternal Grandfather   . Healthy Brother        x 2  . Healthy Daughter   The patient's father is alive at age 82. He has a history of polyps and prostate  cancer.His father had leukemia. His mother ha colon cancer and melanoma. One of his sisters (patient's paternal aunt) had breast cancer in her 53's.The patient's mother is alive at age 89. She has a history of bladder cancer. The patient has 2 brothers and no sisters. The patient denies any family history of uterine, ovarian or pancreatic cancer.    GYNECOLOGIC HISTORY:  No LMP recorded. Patient is postmenopausal. Menarche: 52 years old Age at first live birth: 52 years old She is GX P1.  She is s/p USO Her LMP was age 13. She did not used HRT.    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 Her husband, Victoria Weiss, is Clinical biochemist of a regional food group. The patient's daughter, Victoria Weiss works for [employer]. The patient has 2 grandchildren. She does not attend church.      ADVANCED DIRECTIVES:    HEALTH MAINTENANCE: Social History   Tobacco Use  . Smoking status: Former Research scientist (life sciences)  . Smokeless tobacco: Never Used  . Tobacco comment: Quit >24 yrs ago  Substance Use Topics  . Alcohol use: No  . Drug use: No     Colonoscopy: June 2018/ Dr. Watt Weiss  PAP: UTD  Bone density: osteopenia   Allergies  Allergen Reactions  . No Known Allergies     Current Outpatient Medications  Medication Sig Dispense Refill  . Artificial Saliva (SALIVAMAX) PACK MIX 1 PACKET WITH 1 OZ OF WATER AND STIR. SWISH ONE-HALF OF THE LIQUID FOR 1 MINUTE AND SPIT; REPEAT; USE 8-10 TIMES DAILY  12  . buPROPion (WELLBUTRIN XL) 300 MG 24 hr tablet Take 150 mg by mouth daily.     Marland Kitchen FLUoxetine (PROZAC) 10 MG capsule Take 10 mg by mouth daily.    Marland Kitchen lamoTRIgine (LAMICTAL) 100 MG tablet Take 100 mg by mouth daily.    . traMADol (ULTRAM) 50 MG tablet Take 50 mg by mouth every 6 (six) hours as needed (pain).   0  . VYVANSE 30 MG capsule Take 30 mg by mouth every morning.  0   No current facility-administered medications for this visit.     OBJECTIVE: Middle-aged white woman who  appears well  Vitals:   12/14/17 1558  BP: 134/67  Pulse: 98  Resp: 20  Temp: 98.4 F (36.9 C)  SpO2: 97%     Body mass index is 27.1 kg/m.   Wt Readings from Last 3 Encounters:  12/14/17 141 lb 1.6 oz (64 kg)  08/17/16 153 lb 14.4 oz (69.8 kg)  10/23/13 165 lb 4 oz (75 kg)      ECOG FS:0 - Asymptomatic  Ocular: Sclerae unicteric, pupils round and equal Lymphatic: No cervical or supraclavicular adenopathy Lungs no rales or rhonchi Heart regular rate and rhythm Abd soft, nontender, positive bowel sounds, no masses palpated MSK no focal spinal tenderness, no joint edema Neuro: non-focal, well-oriented, appropriate affect Breasts: No masses palpated in either breast; there are no skin or nipple changes of concern.  Both axillae are benign.   LAB RESULTS:  CMP     Component Value Date/Time   NA 140 08/18/2016 0736  K 4.0 08/18/2016 0736   CL 109 08/18/2016 0736   CO2 27 08/18/2016 0736   GLUCOSE 97 08/18/2016 0736   BUN <5 (L) 08/18/2016 0736   CREATININE 0.85 08/18/2016 0736   CALCIUM 8.6 (L) 08/18/2016 0736   PROT 5.6 (L) 08/18/2016 0736   ALBUMIN 3.0 (L) 08/18/2016 0736   AST 170 (H) 08/18/2016 0736   ALT 376 (H) 08/18/2016 0736   ALKPHOS 167 (H) 08/18/2016 0736   BILITOT 1.2 08/18/2016 0736   GFRNONAA >60 08/18/2016 0736   GFRAA >60 08/18/2016 0736    Lab Results  Component Value Date   TOTALPROTELP 6.8 10/23/2013   ALBUMINELP 56.7 10/23/2013   A1GS 5.2 (H) 10/23/2013   A2GS 10.1 10/23/2013   BETS 7.2 10/23/2013   BETA2SER 6.2 10/23/2013   GAMS 14.6 10/23/2013   MSPIKE NOT DET 10/23/2013   SPEI SEE NOTE 10/23/2013    No results found for: KPAFRELGTCHN, LAMBDASER, KAPLAMBRATIO  Lab Results  Component Value Date   WBC 6.7 08/18/2016   HGB 10.9 (L) 08/18/2016   HCT 34.1 (L) 08/18/2016   MCV 96.3 08/18/2016   PLT 198 08/18/2016    '@LASTCHEMISTRY' @  No results found for: LABCA2  No components found for: [id number]  No results for input(s):  INR in the last 168 hours.  No results found for: LABCA2  No results found for: WUJ811  No results found for: BJY782  No results found for: NFA213  No results found for: CA2729  No components found for: HGQUANT  No results found for: CEA1 / No results found for: CEA1   No results found for: AFPTUMOR  No results found for: CHROMOGRNA  No results found for: PSA1  No visits with results within 3 Day(s) from this visit.  Latest known visit with results is:  Admission on 08/15/2016, Discharged on 08/18/2016  Component Date Value Ref Range Status  . Lipase 08/15/2016 55* 11 - 51 U/L Final  . Sodium 08/15/2016 137  135 - 145 mmol/L Final  . Potassium 08/15/2016 3.5  3.5 - 5.1 mmol/L Final  . Chloride 08/15/2016 105  101 - 111 mmol/L Final  . CO2 08/15/2016 23  22 - 32 mmol/L Final  . Glucose, Bld 08/15/2016 129* 65 - 99 mg/dL Final  . BUN 08/15/2016 9  6 - 20 mg/dL Final  . Creatinine, Ser 08/15/2016 0.91  0.44 - 1.00 mg/dL Final  . Calcium 08/15/2016 9.3  8.9 - 10.3 mg/dL Final  . Total Protein 08/15/2016 6.9  6.5 - 8.1 g/dL Final  . Albumin 08/15/2016 4.0  3.5 - 5.0 g/dL Final  . AST 08/15/2016 599* 15 - 41 U/L Final  . ALT 08/15/2016 532* 14 - 54 U/L Final  . Alkaline Phosphatase 08/15/2016 155* 38 - 126 U/L Final  . Total Bilirubin 08/15/2016 4.3* 0.3 - 1.2 mg/dL Final  . GFR calc non Af Amer 08/15/2016 >60  >60 mL/min Final  . GFR calc Af Amer 08/15/2016 >60  >60 mL/min Final   Comment: (NOTE) The eGFR has been calculated using the CKD EPI equation. This calculation has not been validated in all clinical situations. eGFR's persistently <60 mL/min signify possible Chronic Kidney Disease.   . Anion gap 08/15/2016 9  5 - 15 Final  . WBC 08/15/2016 5.9  4.0 - 10.5 K/uL Final  . RBC 08/15/2016 4.32  3.87 - 5.11 MIL/uL Final  . Hemoglobin 08/15/2016 13.4  12.0 - 15.0 g/dL Final  . HCT 08/15/2016 39.9  36.0 - 46.0 %  Final  . MCV 08/15/2016 92.4  78.0 - 100.0 fL Final  .  MCH 08/15/2016 31.0  26.0 - 34.0 pg Final  . MCHC 08/15/2016 33.6  30.0 - 36.0 g/dL Final  . RDW 08/15/2016 12.7  11.5 - 15.5 % Final  . Platelets 08/15/2016 277  150 - 400 K/uL Final  . Color, Urine 08/15/2016 AMBER* YELLOW Final   BIOCHEMICALS MAY BE AFFECTED BY COLOR  . APPearance 08/15/2016 CLOUDY* CLEAR Final  . Specific Gravity, Urine 08/15/2016 1.017  1.005 - 1.030 Final  . pH 08/15/2016 7.0  5.0 - 8.0 Final  . Glucose, UA 08/15/2016 NEGATIVE  NEGATIVE mg/dL Final  . Hgb urine dipstick 08/15/2016 NEGATIVE  NEGATIVE Final  . Bilirubin Urine 08/15/2016 SMALL* NEGATIVE Final  . Ketones, ur 08/15/2016 NEGATIVE  NEGATIVE mg/dL Final  . Protein, ur 08/15/2016 NEGATIVE  NEGATIVE mg/dL Final  . Nitrite 08/15/2016 NEGATIVE  NEGATIVE Final  . Leukocytes, UA 08/15/2016 NEGATIVE  NEGATIVE Final  . Preg Test, Ur 08/15/2016 NEGATIVE  NEGATIVE Final   Comment:        THE SENSITIVITY OF THIS METHODOLOGY IS >24 mIU/mL   . Magnesium 08/15/2016 2.3  1.7 - 2.4 mg/dL Final  . HIV Screen 4th Generation wRfx 08/16/2016 Non Reactive  Non Reactive Final   Comment: (NOTE) Performed At: [facility] [place], [place] [id number] Lindon Romp MD RW:[id number]   . Sodium 08/16/2016 142  135 - 145 mmol/L Final  . Potassium 08/16/2016 4.1  3.5 - 5.1 mmol/L Final  . Chloride 08/16/2016 108  101 - 111 mmol/L Final  . CO2 08/16/2016 28  22 - 32 mmol/L Final  . Glucose, Bld 08/16/2016 94  65 - 99 mg/dL Final  . BUN 08/16/2016 7  6 - 20 mg/dL Final  . Creatinine, Ser 08/16/2016 0.82  0.44 - 1.00 mg/dL Final  . Calcium 08/16/2016 8.7* 8.9 - 10.3 mg/dL Final  . Total Protein 08/16/2016 6.0* 6.5 - 8.1 g/dL Final  . Albumin 08/16/2016 3.5  3.5 - 5.0 g/dL Final  . AST 08/16/2016 590* 15 - 41 U/L Final  . ALT 08/16/2016 609* 14 - 54 U/L Final  . Alkaline Phosphatase 08/16/2016 168* 38 - 126 U/L Final  . Total Bilirubin 08/16/2016 4.6* 0.3 - 1.2 mg/dL Final  . GFR calc non Af Amer  08/16/2016 >60  >60 mL/min Final  . GFR calc Af Amer 08/16/2016 >60  >60 mL/min Final   Comment: (NOTE) The eGFR has been calculated using the CKD EPI equation. This calculation has not been validated in all clinical situations. eGFR's persistently <60 mL/min signify possible Chronic Kidney Disease.   . Anion gap 08/16/2016 6  5 - 15 Final  . WBC 08/16/2016 5.2  4.0 - 10.5 K/uL Final  . RBC 08/16/2016 4.01  3.87 - 5.11 MIL/uL Final  . Hemoglobin 08/16/2016 12.2  12.0 - 15.0 g/dL Final  . HCT 08/16/2016 37.7  36.0 - 46.0 % Final  . MCV 08/16/2016 94.0  78.0 - 100.0 fL Final  . MCH 08/16/2016 30.4  26.0 - 34.0 pg Final  . MCHC 08/16/2016 32.4  30.0 - 36.0 g/dL Final  . RDW 08/16/2016 13.0  11.5 - 15.5 % Final  . Platelets 08/16/2016 214  150 - 400 K/uL Final  . Lipase 08/16/2016 41  11 - 51 U/L Final  . Cholesterol 08/16/2016 220* 0 - 200 mg/dL Final  . Triglycerides 08/16/2016 39  <150 mg/dL Final  . HDL 08/16/2016 79  >  40 mg/dL Final  . Total CHOL/HDL Ratio 08/16/2016 2.8  RATIO Final  . VLDL 08/16/2016 8  0 - 40 mg/dL Final  . LDL Cholesterol 08/16/2016 133* 0 - 99 mg/dL Final   Comment:        Total Cholesterol/HDL:CHD Risk Coronary Heart Disease Risk Table                     Men   Women  1/2 Average Risk   3.4   3.3  Average Risk       5.0   4.4  2 X Average Risk   9.6   7.1  3 X Average Risk  23.4   11.0        Use the calculated Patient Ratio above and the CHD Risk Table to determine the patient's CHD Risk.        ATP III CLASSIFICATION (LDL):  <100     mg/dL   Optimal  100-129  mg/dL   Near or Above                    Optimal  130-159  mg/dL   Borderline  160-189  mg/dL   High  >190     mg/dL   Very High   . Glucose-Capillary 08/16/2016 92  65 - 99 mg/dL Final  . Bilirubin, Direct 08/16/2016 2.7* 0.1 - 0.5 mg/dL Final  . Glucose-Capillary 08/16/2016 82  65 - 99 mg/dL Final  . Glucose-Capillary 08/16/2016 77  65 - 99 mg/dL Final  . Comment 1 08/16/2016 Notify  RN   Final  . Sodium 08/17/2016 140  135 - 145 mmol/L Final  . Potassium 08/17/2016 3.4* 3.5 - 5.1 mmol/L Final  . Chloride 08/17/2016 109  101 - 111 mmol/L Final  . CO2 08/17/2016 23  22 - 32 mmol/L Final  . Glucose, Bld 08/17/2016 86  65 - 99 mg/dL Final  . BUN 08/17/2016 <5* 6 - 20 mg/dL Final  . Creatinine, Ser 08/17/2016 0.75  0.44 - 1.00 mg/dL Final  . Calcium 08/17/2016 8.5* 8.9 - 10.3 mg/dL Final  . Total Protein 08/17/2016 5.8* 6.5 - 8.1 g/dL Final  . Albumin 08/17/2016 3.3* 3.5 - 5.0 g/dL Final  . AST 08/17/2016 261* 15 - 41 U/L Final  . ALT 08/17/2016 492* 14 - 54 U/L Final  . Alkaline Phosphatase 08/17/2016 194* 38 - 126 U/L Final  . Total Bilirubin 08/17/2016 1.9* 0.3 - 1.2 mg/dL Final  . GFR calc non Af Amer 08/17/2016 >60  >60 mL/min Final  . GFR calc Af Amer 08/17/2016 >60  >60 mL/min Final   Comment: (NOTE) The eGFR has been calculated using the CKD EPI equation. This calculation has not been validated in all clinical situations. eGFR's persistently <60 mL/min signify possible Chronic Kidney Disease.   . Anion gap 08/17/2016 8  5 - 15 Final  . WBC 08/17/2016 4.9  4.0 - 10.5 K/uL Final  . RBC 08/17/2016 3.77* 3.87 - 5.11 MIL/uL Final  . Hemoglobin 08/17/2016 11.6* 12.0 - 15.0 g/dL Final  . HCT 08/17/2016 35.8* 36.0 - 46.0 % Final  . MCV 08/17/2016 95.0  78.0 - 100.0 fL Final  . MCH 08/17/2016 30.8  26.0 - 34.0 pg Final  . MCHC 08/17/2016 32.4  30.0 - 36.0 g/dL Final  . RDW 08/17/2016 13.2  11.5 - 15.5 % Final  . Platelets 08/17/2016 197  150 - 400 K/uL Final  . Lipase 08/17/2016 33  11 -  51 U/L Final  . Glucose-Capillary 08/16/2016 75  65 - 99 mg/dL Final  . Comment 1 08/16/2016 Notify RN   Final  . MRSA, PCR 08/16/2016 NEGATIVE  NEGATIVE Final  . Staphylococcus aureus 08/16/2016 NEGATIVE  NEGATIVE Final   Comment:        The Xpert SA Assay (FDA approved for NASAL specimens in patients over 22 years of age), is one component of a comprehensive  surveillance program.  Test performance has been validated by [facility] for patients greater than or equal to 37 year old. It is not intended to diagnose infection nor to guide or monitor treatment.   . Glucose-Capillary 08/17/2016 80  65 - 99 mg/dL Final  . Glucose-Capillary 08/17/2016 83  65 - 99 mg/dL Final  . Glucose-Capillary 08/17/2016 147* 65 - 99 mg/dL Final  . Comment 1 08/17/2016 Notify RN   Final  . WBC 08/18/2016 6.7  4.0 - 10.5 K/uL Final  . RBC 08/18/2016 3.54* 3.87 - 5.11 MIL/uL Final  . Hemoglobin 08/18/2016 10.9* 12.0 - 15.0 g/dL Final  . HCT 08/18/2016 34.1* 36.0 - 46.0 % Final  . MCV 08/18/2016 96.3  78.0 - 100.0 fL Final  . MCH 08/18/2016 30.8  26.0 - 34.0 pg Final  . MCHC 08/18/2016 32.0  30.0 - 36.0 g/dL Final  . RDW 08/18/2016 13.3  11.5 - 15.5 % Final  . Platelets 08/18/2016 198  150 - 400 K/uL Final  . Sodium 08/18/2016 140  135 - 145 mmol/L Final  . Potassium 08/18/2016 4.0  3.5 - 5.1 mmol/L Final  . Chloride 08/18/2016 109  101 - 111 mmol/L Final  . CO2 08/18/2016 27  22 - 32 mmol/L Final  . Glucose, Bld 08/18/2016 97  65 - 99 mg/dL Final  . BUN 08/18/2016 <5* 6 - 20 mg/dL Final  . Creatinine, Ser 08/18/2016 0.85  0.44 - 1.00 mg/dL Final  . Calcium 08/18/2016 8.6* 8.9 - 10.3 mg/dL Final  . Total Protein 08/18/2016 5.6* 6.5 - 8.1 g/dL Final  . Albumin 08/18/2016 3.0* 3.5 - 5.0 g/dL Final  . AST 08/18/2016 170* 15 - 41 U/L Final  . ALT 08/18/2016 376* 14 - 54 U/L Final  . Alkaline Phosphatase 08/18/2016 167* 38 - 126 U/L Final  . Total Bilirubin 08/18/2016 1.2  0.3 - 1.2 mg/dL Final  . GFR calc non Af Amer 08/18/2016 >60  >60 mL/min Final  . GFR calc Af Amer 08/18/2016 >60  >60 mL/min Final   Comment: (NOTE) The eGFR has been calculated using the CKD EPI equation. This calculation has not been validated in all clinical situations. eGFR's persistently <60 mL/min signify possible Chronic Kidney Disease.   . Anion gap 08/18/2016 4* 5 - 15 Final  .  Glucose-Capillary 08/17/2016 144* 65 - 99 mg/dL Final  . Comment 1 08/17/2016 Notify RN   Final  . Glucose-Capillary 08/18/2016 87  65 - 99 mg/dL Final  . Glucose-Capillary 08/18/2016 90  65 - 99 mg/dL Final    (this displays the last labs from the last 3 days)  Lab Results  Component Value Date   TOTALPROTELP 6.8 10/23/2013   ALBUMINELP 56.7 10/23/2013   A1GS 5.2 (H) 10/23/2013   A2GS 10.1 10/23/2013   BETS 7.2 10/23/2013   BETA2SER 6.2 10/23/2013   GAMS 14.6 10/23/2013   MSPIKE NOT DET 10/23/2013   SPEI SEE NOTE 10/23/2013   (this displays SPEP labs)  No results found for: KPAFRELGTCHN, LAMBDASER, KAPLAMBRATIO (kappa/lambda light chains)  No results  found for: HGBA, HGBA2QUANT, HGBFQUANT, HGBSQUAN (Hemoglobinopathy evaluation)   No results found for: LDH  No results found for: IRON, TIBC, IRONPCTSAT (Iron and TIBC)  No results found for: FERRITIN  Urinalysis    Component Value Date/Time   COLORURINE AMBER (A) 08/15/2016 1929   APPEARANCEUR CLOUDY (A) 08/15/2016 1929   LABSPEC 1.017 08/15/2016 1929   PHURINE 7.0 08/15/2016 1929   GLUCOSEU NEGATIVE 08/15/2016 1929   HGBUR NEGATIVE 08/15/2016 1929   BILIRUBINUR SMALL (A) 08/15/2016 1929   KETONESUR NEGATIVE 08/15/2016 1929   PROTEINUR NEGATIVE 08/15/2016 1929   UROBILINOGEN 0.2 07/06/2014 0704   NITRITE NEGATIVE 08/15/2016 1929   LEUKOCYTESUR NEGATIVE 08/15/2016 1929     STUDIES: Mr Breast Bilateral W Wo Contrast Inc Cad  Result Date: 11/28/2017 CLINICAL DATA:  Recent genetic testing demonstrated increase risk of breast cancer. Family history of breast cancer in aunt diagnosed at age 8. LABS:  None. EXAM: BILATERAL BREAST MRI WITH AND WITHOUT CONTRAST TECHNIQUE: Multiplanar, multisequence MR images of both breasts were obtained prior to and following the intravenous administration of 13 ml of MultiHance. Three-dimensional MR images were rendered by post-processing the original MR data using the DynaCAD thin  client. The 3D MR images are interpreted and the findings are included in the complete MRI report below. COMPARISON:  Previous exam(s). FINDINGS: Breast composition: b. Scattered fibroglandular tissue. Background parenchymal enhancement: Moderate. Right breast: No mass or abnormal enhancement. Left breast: No mass or abnormal enhancement. Lymph nodes: No abnormal appearing lymph nodes. Ancillary findings:  None. IMPRESSION: No MRI evidence of malignancy in either breast. RECOMMENDATION: Bilateral screening mammogram in July 2020. Based on the recommendations of the [place], annual screening MRI is suggested in addition to annual mammography if the patient has an estimated lifetime risk of developing breast cancer which is greater than 20%. BI-RADS CATEGORY  1: Negative. Electronically Signed   By: Fidela Salisbury M.D.   On: 11/28/2017 12:12    ELIGIBLE FOR AVAILABLE RESEARCH PROTOCOL: no  ASSESSMENT: 52 y.o. 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   (1) genetics testing 09/29/2017 through the My Risk hereditary cancer panel offered by [facility] no deleterious mutations in APC, AXIN2, BARD1, BMPR1A, BRCA1, BRCA2, BRIP1, CDH1, CDK4, CDKN2A, CHEK2, EPCAM (large rearrangement only), HOXB13 (sequencing only), GALNT12, MLH1, MSH2, MSH3 (excluding repetitive portions of exon 1), MSH6, MUTYH, NBN, NTHL1, PALB2, PMS2, PTEN, RAD51, RAD51D, RNF43, RPS20, SMAD4, STK11, TP53, Sequencing was performed for select regions of POLE and POLD1, and large rearrangement analysis was performed for selection regions of GREM1.   (2) Breast cancer high-risk: (As high as 50% lifetime)   (a) risk reduction: options   (i) anti-estrogens-- patient opts against   (ii) bilateral mastectomies-- patient opts against   (iii) lifestyle changes-- discussed   (b) intensified screening: set up through Dr. Gaetano Net   (i) yearly mammography with tomography   (ii) yearly breast  MRI   (iii) biannual breast exam by MD  (3) Pancreatic cancer, elevated risk: insufficient data to quantitate risk, no established screening protocol  (a) yearly MRCP: recommended if screening pursued  (b) endoscopic [place]: excellent alternative if MRCP not desired  (c) trans-abdominal US: convenient, insensitive  (4) Ovarian cancer, possible increased risk: not quantitated, no established screening protocol  (a) consider USO if risk-reduction desired  (5) additional genetics counseling: meeting with counselor pending  PLAN: I spent approximately 60 minutes face to face with Victoria Weiss with more than 50% of that time spent in counseling  and coordination of care. Specifically we reviewed the biology of the patient's diagnosis and the specifics of her situation.  We went over the results of her genetics testing. I reviewed the NCCN statement on ATM associated risk. It is best to discuss breast, pancreas and ovarian cancer risk separately.  I would quote her a 40%+ lifetime risk of breast cancer. Her two options in that regard are intensified screening-- this has already been set up through Dr Clovis Fredrickson risk reduction strategies, which were discussed today.  In terms of breast cancer risk reduction we discussed bilateral mastectomies, the types of reconstruction available; anti-estrogens including tamoxifen and congeners and anastrozole and congeners; and lifestyle changes. Bilateral mastectomies would reduce risk by more than 90%. Given the possible complications from the various procedures as well as changes in body image Victoria Weiss does not wish to pursue that option.  Anti-estrogens would reduce her risk of breast cancer by half. We discussed the MOA of the various agents as well as the possible toxicity side effects and complications of these drugs, which would need to be taken for 5 years.  She does not wish to proceed with these either given the possible side effect  We then discussed lifestyle  changes and specifically recommended exercise 45 minutes 5 times a week and a diet rich in vegetables and low in carbohydrates.  She will consider initiating those changes.  We then discussed pancreatic cancer.  We discussed the reason screening has not proved effective: By the time the mass in the pancreas is 5 mm it will consist of a bili and cells and those cells are not barred from shedding by any barrier.  Seeding of the peritoneum can easily occur before the mass could be detected by any modality.  Since no screening has been proved effective, and since the risk in her case has not been quantified, avoiding any test in the absence of symptoms is reasonable.  However we are offering patients who wish to be screened yearly MRCP.  This is very sensitive, noninvasive, and involves no radiation.  Other options would be endoscopic ultrasonography or even transabdominal ultrasonography understanding that the latter while simple and inexpensive is also less sensitive.  The niece is considering a yearly MRCP and will discuss this option with Dr. Driscilla Grammes her gastroenterologist  The discussion regarding her remaining ovary is similar.  We do not have a quantitative risk.  Just as with pancreatic cancer there is no family history of ovarian cancer.  There is also no established screening for ovarian cancer: We do not know that biannual ultrasounds and lab work detects tumors earlier or saves lives.  The one option here would be unilateral salpingo-oophorectomy.  This is generally a pretty straightforward procedure, not dissimilar to the bilateral tubal ligation she underwent in the past, and if she wishes to proceed she will discuss that with Dr. Gertie Fey.  I think it would be useful for Victoria Weiss to meet with 1 of our genetics counselors first of all to discuss whether any further genetic testing should be obtained in light of her family history above, and secondly so that she can be entered in our records so that if  new developments occur or new data develops in ATM mutations she can be alerted.  I am not making a return appointment here with me but I will be glad to see her at any point in the future if I can be of further help.      Darwin has a good  understanding of the overall plan. She agrees with it. She knows the goal of treatment in her case is cure. She will call with any problems that may develop before her next visit here.   Victoria Weiss, Victoria Dad, MD  12/14/17 4:59 PM Medical Oncology and Hematology [facility] [address] [place], [place] [address] Tel. [phone]    Fax. [phone]  Alice Rieger, am acting as scribe for Chauncey Cruel MD.  I, Lurline Del MD, have reviewed the above documentation for accuracy and completeness, and I agree with the above.

## 2017-12-14 ENCOUNTER — Encounter: Payer: Self-pay | Admitting: Oncology

## 2017-12-14 ENCOUNTER — Inpatient Hospital Stay: Payer: BLUE CROSS/BLUE SHIELD | Attending: Oncology | Admitting: Oncology

## 2017-12-14 DIAGNOSIS — Z87891 Personal history of nicotine dependence: Secondary | ICD-10-CM | POA: Diagnosis not present

## 2017-12-14 DIAGNOSIS — Z8052 Family history of malignant neoplasm of bladder: Secondary | ICD-10-CM

## 2017-12-14 DIAGNOSIS — Z1501 Genetic susceptibility to malignant neoplasm of breast: Secondary | ICD-10-CM | POA: Diagnosis not present

## 2017-12-14 DIAGNOSIS — Z1589 Genetic susceptibility to other disease: Secondary | ICD-10-CM | POA: Insufficient documentation

## 2017-12-14 DIAGNOSIS — Z1509 Genetic susceptibility to other malignant neoplasm: Secondary | ICD-10-CM | POA: Insufficient documentation

## 2017-12-14 DIAGNOSIS — Z803 Family history of malignant neoplasm of breast: Secondary | ICD-10-CM | POA: Diagnosis not present

## 2017-12-14 DIAGNOSIS — Z1239 Encounter for other screening for malignant neoplasm of breast: Secondary | ICD-10-CM | POA: Insufficient documentation

## 2017-12-14 DIAGNOSIS — Z79899 Other long term (current) drug therapy: Secondary | ICD-10-CM | POA: Diagnosis not present

## 2017-12-15 ENCOUNTER — Telehealth: Payer: Self-pay | Admitting: Oncology

## 2017-12-15 NOTE — Telephone Encounter (Signed)
Tried to reach regarding 10/28 per MD

## 2017-12-15 NOTE — Telephone Encounter (Signed)
Per 10/16 no los

## 2017-12-18 DIAGNOSIS — Z23 Encounter for immunization: Secondary | ICD-10-CM | POA: Diagnosis not present

## 2017-12-23 ENCOUNTER — Telehealth: Payer: Self-pay | Admitting: Podiatry

## 2017-12-23 NOTE — Telephone Encounter (Signed)
I'm calling to get the name of the doctor I saw two months ago. When I saw him, we were talking about my headaches and he was going to give me the name of someone he sees that is between a masseuse and a chiropractor. Please call me back at [phone].

## 2017-12-26 ENCOUNTER — Other Ambulatory Visit: Payer: Self-pay | Admitting: Oncology

## 2017-12-26 ENCOUNTER — Inpatient Hospital Stay (HOSPITAL_BASED_OUTPATIENT_CLINIC_OR_DEPARTMENT_OTHER): Payer: BLUE CROSS/BLUE SHIELD | Admitting: Genetic Counselor

## 2017-12-26 ENCOUNTER — Encounter: Payer: Self-pay | Admitting: Genetic Counselor

## 2017-12-26 DIAGNOSIS — Z808 Family history of malignant neoplasm of other organs or systems: Secondary | ICD-10-CM

## 2017-12-26 DIAGNOSIS — Z1589 Genetic susceptibility to other disease: Secondary | ICD-10-CM

## 2017-12-26 DIAGNOSIS — Z1509 Genetic susceptibility to other malignant neoplasm: Secondary | ICD-10-CM

## 2017-12-26 DIAGNOSIS — Z800 Family history of malignant neoplasm of digestive organs: Secondary | ICD-10-CM | POA: Insufficient documentation

## 2017-12-26 DIAGNOSIS — Z1379 Encounter for other screening for genetic and chromosomal anomalies: Secondary | ICD-10-CM

## 2017-12-26 DIAGNOSIS — Z1501 Genetic susceptibility to malignant neoplasm of breast: Secondary | ICD-10-CM

## 2017-12-26 DIAGNOSIS — Z803 Family history of malignant neoplasm of breast: Secondary | ICD-10-CM

## 2017-12-26 NOTE — Telephone Encounter (Signed)
[place] [phone]

## 2017-12-26 NOTE — Telephone Encounter (Signed)
Left message informing pt of Dr. Mellody Drown masseuse.

## 2017-12-26 NOTE — Progress Notes (Signed)
GENETIC TEST RESULTS   Patient Name: Victoria Weiss Patient Age: 52 y.o. Encounter Date: 12/26/2017  Referring Provider: Lurline Del, MD    Victoria Weiss was seen in the [place] clinic on December 26, 2017 due to a family history of cancer, identification of an ATM familial mutation, and concern regarding a hereditary predisposition to cancer in the family. Please refer to the prior Genetics clinic note for more information regarding Victoria Weiss's medical and family histories and our assessment at the time.   FAMILY HISTORY:  We obtained a detailed, 4-generation family history.  Significant diagnoses are listed below: Family History  Problem Relation Age of Onset  . Prostate cancer Father        Living, 66  . Bladder Cancer Mother        Living, 66  . Hypertension Mother   . Aneurysm Mother        brain  . Colon cancer Maternal Grandfather        dx in his late 50s-50s  . Healthy Brother        x 2  . Healthy Daughter   . Heart attack Maternal Aunt   . Breast cancer Paternal Aunt        dx in her 3s, d. in her 41s  . Colon cancer Paternal Grandmother        dx in her 19s  . Melanoma Paternal Grandmother        dx in her 84s  . Leukemia Paternal Grandfather        dx in her 45s  . Healthy Brother     The patient has one daughter and a grandson and granddaughter who are all cancer free.  She has two brothers who are cancer free.  Both parents are living.  The patient's mother has had colon polyps and was diagnosed with bladder cancer in her 56's.  She had a brother and sister.  Her sister died of a heart attack in her early 63's.  The maternal grandparents are deceased.  The grandmother died of complications of surgery and the grandfather died of colon cancer at 42.  The patient's father has colon polyps and was diagnosed with prostate cancer in his late 34's.  He had one sister who had breast cancer in her 50's.  The paternal grandparents are deceased. The grandmother  had colon cancer in her 66's and melanoma in her 81's and died at 51.  The grandfather had leukemia and died in his 55's.  Patient's maternal ancestors are of [place], and paternal ancestors are of [place]. There is no reported Ashkenazi Jewish ancestry. There is no known consanguinity.  GENETIC TESTING:  Victoria Weiss was tested through her OB/GYN office at [facility].  Victoria Weiss pursued genetic testing of the [facility] test. 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 There were no deleterious mutations in APC, ATM, AXIN2, BARD1, BMPR1A, BRCA1, BRCA2, BRIP1, CHD1, CDK4, CDKN2A, CHEK2, EPCAM (large rearrangement only), HOXB13, (sequencing only), GALNT12, MLH1, MSH2, MSH3 (excluding repetitive portions of exon 1), MSH6, MUTYH, NBN, NTHL1, PALB2, PMS2, PTEN, RAD51C, RAD51D, RNF43, RPS20, SMAD4, STK11, and TP53. Sequencing was performed for select regions of POLE and POLD1, and large rearrangement analysis was performed for select regions of GREM1.  DISCUSSION: The ATM gene is involved in the detection and surveillance of DNA damage.  ATM phosphorylation of BRCA1 is critical for proper response to DNA double-strand breaks.  This is believed to be the reason for the role ATM has in breast cancer risk.  Individuals with a ATM mutation are at a greater risk for having children with Ataxia-telangiectasia (A-T).  AT is characterized by progressive cerebellar degeneration (ataxia), dilated blood vessels in the eyes and skin (telangiectasia), immunodeficiency, chromosomal instability, increased sensitivity to ionizing radiation and a predisposition to lymphoma and leukemia.  Therefore, individuals of childbearing age who have a known ATM mutation may want to consider having their spouse tested to determine their risk for  having a child with A-T.  Studies of these families demonstrated increased incident of breast cancer in the mothers (who are heterozygous carriers) of the affected children, thus prompting further evaluation of the relationship between breast cancer and ATM.  Women who are heterozygous ATM carriers have an increase breast cancer risk.  They have 5-fold increased risk of breast cancer <50 years, and 2-3 fold increased risk for breast cancer overall.  In families with familial breast cancer that were negative for BRCA1 or BRCA2 genes, approximately 2.7% of women were found to have one ATM mutation.  In families with both breast cancer and leukemia, 6.7% of women were found to have one ATM mutation.  There has been some evidence that radiation treatment may increase the risk for breast cancer in the contralateral breast. Despite this risk, we do not recommend declining radiation treatment for her breast cancer if it is recommended, as the risk for having a recurrence of breast cancer based on not going through radiation may be greater than her risk for getting breast cancer again from the radiation.   Management for individuals with ATM mutations can be found in the NCCN guidelines (v.3.2019).  These guidelines recommend the following:  Breast Cancer     Screening: Annual mammogram with consideration of tomosynthesis and consider breast MRI with contrast starting at age 43 years.  Risk Reducing Mastectomy: Evidence insufficient, consider based on family history  Ovarian Cancer  No increased risk for ovarian cancer, therefore no recommendations  Other Cancer Risks  Unknown or insufficient evidence for pancreatic or prostate cancer risk  FAMILY MEMBERS: It is important that all of Victoria Weiss's relatives (both men and women) know of the presence of this gene mutation. Site-specific genetic testing can sort out who in the family is at risk and who is not.   Victoria Weiss's children and siblings have a 50%  chance to have inherited this mutation.  We recommend they have genetic testing for this same mutation, as identifying the presence of this mutation would allow them to also take advantage of risk-reducing measures. Each of her parents also have a 50% chance of having this mutation. We recommend that they undergo genetic testing in order to identify the presence of this mutation, allowing them to also take advantage of risk-reducing measures, as well as identifying the side of the family this is coming from.  At this time, we assume it is coming from the paternal side.  SUPPORT AND RESOURCES: If Ms. Campanile is interested in ATM-specific information and support, there are two groups, Facing Our Risk (www.facingourrisk.com) and Bright Pink (www.brightpink.org) which some people have found useful. They provide opportunities to speak with other individuals from high-risk families. We also provided information about genetic services in her brothers' towns so that they may take advantage of genetic testing.  To locate genetic counselors in other cities, visit the  website of the [employer] (ArtistMovie.se) and Secretary/administrator for a counselor by zip code.  We encouraged Ms. Fees to remain in contact with [place] on an annual basis so we can update her personal and family histories, and let her know of advances in cancer genetics that may benefit the family. Our contact number was provided. Ms. Smitherman questions were answered to her satisfaction today, and she knows she is welcome to call anytime with additional questions.   Karen P. Florene Glen, [place], [facility] Certified Genetic Counselor [email or web address] phone: [phone]

## 2018-01-18 DIAGNOSIS — Z049 Encounter for examination and observation for unspecified reason: Secondary | ICD-10-CM | POA: Diagnosis not present

## 2018-01-18 DIAGNOSIS — R51 Headache: Secondary | ICD-10-CM | POA: Diagnosis not present

## 2018-01-19 ENCOUNTER — Other Ambulatory Visit: Payer: Self-pay | Admitting: Specialist

## 2018-01-19 DIAGNOSIS — R51 Headache: Principal | ICD-10-CM

## 2018-01-19 DIAGNOSIS — R519 Headache, unspecified: Secondary | ICD-10-CM

## 2018-01-21 ENCOUNTER — Ambulatory Visit
Admission: RE | Admit: 2018-01-21 | Discharge: 2018-01-21 | Disposition: A | Discharge status: Home / Self Care | Payer: BLUE CROSS/BLUE SHIELD | Source: Ambulatory Visit | Attending: Specialist | Admitting: Specialist

## 2018-01-21 DIAGNOSIS — R51 Headache: Principal | ICD-10-CM

## 2018-01-21 DIAGNOSIS — R519 Headache, unspecified: Secondary | ICD-10-CM

## 2018-01-30 ENCOUNTER — Other Ambulatory Visit: Payer: BLUE CROSS/BLUE SHIELD

## 2018-02-02 DIAGNOSIS — G518 Other disorders of facial nerve: Secondary | ICD-10-CM | POA: Diagnosis not present

## 2018-02-02 DIAGNOSIS — R51 Headache: Secondary | ICD-10-CM | POA: Diagnosis not present

## 2018-02-02 DIAGNOSIS — M542 Cervicalgia: Secondary | ICD-10-CM | POA: Diagnosis not present

## 2018-02-02 DIAGNOSIS — M791 Myalgia, unspecified site: Secondary | ICD-10-CM | POA: Diagnosis not present

## 2018-02-17 DIAGNOSIS — G518 Other disorders of facial nerve: Secondary | ICD-10-CM | POA: Diagnosis not present

## 2018-02-17 DIAGNOSIS — M791 Myalgia, unspecified site: Secondary | ICD-10-CM | POA: Diagnosis not present

## 2018-02-17 DIAGNOSIS — M542 Cervicalgia: Secondary | ICD-10-CM | POA: Diagnosis not present

## 2018-02-17 DIAGNOSIS — R51 Headache: Secondary | ICD-10-CM | POA: Diagnosis not present

## 2018-03-03 DIAGNOSIS — G518 Other disorders of facial nerve: Secondary | ICD-10-CM | POA: Diagnosis not present

## 2018-03-03 DIAGNOSIS — R51 Headache: Secondary | ICD-10-CM | POA: Diagnosis not present

## 2018-03-03 DIAGNOSIS — M791 Myalgia, unspecified site: Secondary | ICD-10-CM | POA: Diagnosis not present

## 2018-03-03 DIAGNOSIS — M542 Cervicalgia: Secondary | ICD-10-CM | POA: Diagnosis not present

## 2018-03-21 DIAGNOSIS — M791 Myalgia, unspecified site: Secondary | ICD-10-CM | POA: Diagnosis not present

## 2018-03-21 DIAGNOSIS — M542 Cervicalgia: Secondary | ICD-10-CM | POA: Diagnosis not present

## 2018-03-21 DIAGNOSIS — R51 Headache: Secondary | ICD-10-CM | POA: Diagnosis not present

## 2018-03-21 DIAGNOSIS — G518 Other disorders of facial nerve: Secondary | ICD-10-CM | POA: Diagnosis not present

## 2018-04-03 DIAGNOSIS — E559 Vitamin D deficiency, unspecified: Secondary | ICD-10-CM | POA: Diagnosis not present

## 2018-04-11 DIAGNOSIS — G518 Other disorders of facial nerve: Secondary | ICD-10-CM | POA: Diagnosis not present

## 2018-04-11 DIAGNOSIS — M791 Myalgia, unspecified site: Secondary | ICD-10-CM | POA: Diagnosis not present

## 2018-04-11 DIAGNOSIS — M542 Cervicalgia: Secondary | ICD-10-CM | POA: Diagnosis not present

## 2018-04-11 DIAGNOSIS — R51 Headache: Secondary | ICD-10-CM | POA: Diagnosis not present

## 2018-04-12 DIAGNOSIS — F341 Dysthymic disorder: Secondary | ICD-10-CM | POA: Diagnosis not present

## 2018-04-12 DIAGNOSIS — F900 Attention-deficit hyperactivity disorder, predominantly inattentive type: Secondary | ICD-10-CM | POA: Diagnosis not present

## 2018-04-12 DIAGNOSIS — F418 Other specified anxiety disorders: Secondary | ICD-10-CM | POA: Diagnosis not present

## 2018-04-12 DIAGNOSIS — F331 Major depressive disorder, recurrent, moderate: Secondary | ICD-10-CM | POA: Diagnosis not present

## 2018-04-18 DIAGNOSIS — M4310 Spondylolisthesis, site unspecified: Secondary | ICD-10-CM | POA: Diagnosis not present

## 2018-04-18 DIAGNOSIS — M4716 Other spondylosis with myelopathy, lumbar region: Secondary | ICD-10-CM | POA: Diagnosis not present

## 2018-04-18 DIAGNOSIS — Z6824 Body mass index (BMI) 24.0-24.9, adult: Secondary | ICD-10-CM | POA: Diagnosis not present

## 2018-04-18 DIAGNOSIS — M5126 Other intervertebral disc displacement, lumbar region: Secondary | ICD-10-CM | POA: Diagnosis not present

## 2018-05-01 DIAGNOSIS — M542 Cervicalgia: Secondary | ICD-10-CM | POA: Diagnosis not present

## 2018-05-01 DIAGNOSIS — R51 Headache: Secondary | ICD-10-CM | POA: Diagnosis not present

## 2018-05-01 DIAGNOSIS — G518 Other disorders of facial nerve: Secondary | ICD-10-CM | POA: Diagnosis not present

## 2018-05-01 DIAGNOSIS — M791 Myalgia, unspecified site: Secondary | ICD-10-CM | POA: Diagnosis not present

## 2018-05-10 DIAGNOSIS — R51 Headache: Secondary | ICD-10-CM | POA: Diagnosis not present

## 2018-05-17 DIAGNOSIS — R51 Headache: Secondary | ICD-10-CM | POA: Diagnosis not present

## 2018-05-22 DIAGNOSIS — R51 Headache: Secondary | ICD-10-CM | POA: Diagnosis not present

## 2018-07-06 DIAGNOSIS — M2550 Pain in unspecified joint: Secondary | ICD-10-CM | POA: Diagnosis not present

## 2018-07-12 DIAGNOSIS — M542 Cervicalgia: Secondary | ICD-10-CM | POA: Diagnosis not present

## 2018-07-12 DIAGNOSIS — M47812 Spondylosis without myelopathy or radiculopathy, cervical region: Secondary | ICD-10-CM | POA: Diagnosis not present

## 2018-07-12 DIAGNOSIS — M5412 Radiculopathy, cervical region: Secondary | ICD-10-CM | POA: Diagnosis not present

## 2018-07-13 DIAGNOSIS — M2550 Pain in unspecified joint: Secondary | ICD-10-CM | POA: Diagnosis not present

## 2018-07-20 DIAGNOSIS — F900 Attention-deficit hyperactivity disorder, predominantly inattentive type: Secondary | ICD-10-CM | POA: Diagnosis not present

## 2018-07-20 DIAGNOSIS — F341 Dysthymic disorder: Secondary | ICD-10-CM | POA: Diagnosis not present

## 2018-07-28 DIAGNOSIS — R51 Headache: Secondary | ICD-10-CM | POA: Diagnosis not present

## 2018-08-15 ENCOUNTER — Encounter: Payer: Self-pay | Admitting: *Deleted

## 2018-08-15 DIAGNOSIS — R51 Headache: Secondary | ICD-10-CM | POA: Diagnosis not present

## 2018-08-15 DIAGNOSIS — M2550 Pain in unspecified joint: Secondary | ICD-10-CM | POA: Diagnosis not present

## 2018-08-28 DIAGNOSIS — J019 Acute sinusitis, unspecified: Secondary | ICD-10-CM | POA: Diagnosis not present

## 2018-08-28 DIAGNOSIS — R21 Rash and other nonspecific skin eruption: Secondary | ICD-10-CM | POA: Diagnosis not present

## 2018-08-28 DIAGNOSIS — Z1159 Encounter for screening for other viral diseases: Secondary | ICD-10-CM | POA: Diagnosis not present

## 2018-08-30 DIAGNOSIS — R51 Headache: Secondary | ICD-10-CM | POA: Diagnosis not present

## 2018-09-27 DIAGNOSIS — M542 Cervicalgia: Secondary | ICD-10-CM | POA: Diagnosis not present

## 2018-09-29 DIAGNOSIS — N952 Postmenopausal atrophic vaginitis: Secondary | ICD-10-CM | POA: Diagnosis not present

## 2018-09-29 DIAGNOSIS — Z6822 Body mass index (BMI) 22.0-22.9, adult: Secondary | ICD-10-CM | POA: Diagnosis not present

## 2018-09-29 DIAGNOSIS — E559 Vitamin D deficiency, unspecified: Secondary | ICD-10-CM | POA: Diagnosis not present

## 2018-09-29 DIAGNOSIS — Z1231 Encounter for screening mammogram for malignant neoplasm of breast: Secondary | ICD-10-CM | POA: Diagnosis not present

## 2018-09-29 DIAGNOSIS — N941 Unspecified dyspareunia: Secondary | ICD-10-CM | POA: Diagnosis not present

## 2018-09-29 DIAGNOSIS — Z01419 Encounter for gynecological examination (general) (routine) without abnormal findings: Secondary | ICD-10-CM | POA: Diagnosis not present

## 2018-10-02 DIAGNOSIS — R51 Headache: Secondary | ICD-10-CM | POA: Diagnosis not present

## 2018-10-03 DIAGNOSIS — E785 Hyperlipidemia, unspecified: Secondary | ICD-10-CM | POA: Diagnosis not present

## 2018-10-03 DIAGNOSIS — E559 Vitamin D deficiency, unspecified: Secondary | ICD-10-CM | POA: Diagnosis not present

## 2018-10-03 DIAGNOSIS — Z79899 Other long term (current) drug therapy: Secondary | ICD-10-CM | POA: Diagnosis not present

## 2018-10-03 DIAGNOSIS — Z0000 Encounter for general adult medical examination without abnormal findings: Secondary | ICD-10-CM | POA: Diagnosis not present

## 2018-10-03 DIAGNOSIS — Z23 Encounter for immunization: Secondary | ICD-10-CM | POA: Diagnosis not present

## 2018-10-28 DIAGNOSIS — M542 Cervicalgia: Secondary | ICD-10-CM | POA: Diagnosis not present

## 2018-11-14 ENCOUNTER — Other Ambulatory Visit: Payer: Self-pay

## 2018-11-16 ENCOUNTER — Ambulatory Visit (INDEPENDENT_AMBULATORY_CARE_PROVIDER_SITE_OTHER): Payer: BC Managed Care – PPO | Admitting: Obstetrics & Gynecology

## 2018-11-16 ENCOUNTER — Encounter: Payer: Self-pay | Admitting: Obstetrics & Gynecology

## 2018-11-16 ENCOUNTER — Other Ambulatory Visit (HOSPITAL_COMMUNITY)
Admission: RE | Admit: 2018-11-16 | Discharge: 2018-11-16 | Disposition: A | Discharge status: Home / Self Care | Payer: BC Managed Care – PPO | Source: Ambulatory Visit | Attending: Obstetrics & Gynecology | Admitting: Obstetrics & Gynecology

## 2018-11-16 ENCOUNTER — Other Ambulatory Visit: Payer: Self-pay

## 2018-11-16 VITALS — BP 118/80 | HR 80 | Temp 97.3°F | Ht 66.0 in | Wt 139.2 lb

## 2018-11-16 DIAGNOSIS — Z1589 Genetic susceptibility to other disease: Secondary | ICD-10-CM

## 2018-11-16 DIAGNOSIS — Z124 Encounter for screening for malignant neoplasm of cervix: Secondary | ICD-10-CM | POA: Diagnosis not present

## 2018-11-16 DIAGNOSIS — Z01419 Encounter for gynecological examination (general) (routine) without abnormal findings: Secondary | ICD-10-CM

## 2018-11-16 DIAGNOSIS — M810 Age-related osteoporosis without current pathological fracture: Secondary | ICD-10-CM | POA: Diagnosis not present

## 2018-11-16 DIAGNOSIS — Z1501 Genetic susceptibility to malignant neoplasm of breast: Secondary | ICD-10-CM

## 2018-11-16 DIAGNOSIS — Z1509 Genetic susceptibility to other malignant neoplasm: Secondary | ICD-10-CM

## 2018-11-16 NOTE — Progress Notes (Addendum)
53 y.o. G44P1021 Married White or Caucasian female here for new patient exam.  Has not been on HRT.  Has strong family hx of cancer especially colon.  Genetic testing was positive for ATM mutation (ATM c.5549 del ([id number]).  This increased breast cancer risk as high as 50%.  She did see Dr. Jana Hakim and decided against antiestrogen therapy or prophylactic surgery.  She is going yearly 3D MMG and yearly bream MRI.  Possible ovarian cancer risk present as well.  BSO discussed.  No adequate screening recommendations exist.  Pt aware there is some risk but does not want to do anything at this time from surgical standpoint.    Of note, the genetic testing results do not state and increased risks and the note in Epic from Roma Kayser does not recommend any screening or ovary removal.  (reveiwed Up to Date as well and no additional recommendations are present with ATM mutations except increased screening for breast cancer.)  She is having a lot of issues with vaginal dryness and tightness.  She's really not tried anything topical.  Did the Josph Macho Touch x 4 treatments.  Cost her $1600.  Decided to change practices this year.  Has seen Dr. Gaetano Net for years.  Just decided to make a change.    Psychiatrist:  Pearson Grippe, MD  Patient's last menstrual period was 03/01/2016 (approximate).          Sexually active: No.  The current method of family planning is abstinence.    Exercising: Yes.    walking, strength  Smoker:  no  Health Maintenance: Pap:  09/20/16 neg  History of abnormal Pap:  Yes, age 86 MMG:  10/03/18 BIRADS1:neg.  Colonoscopy:  05/29/14.  Dr. Oletta Lamas, follow up recommended in 5 years.  Followed by Dr. Watt Climes now. BMD:  09/27/17 osteoporosis TDaP:  current Pneumonia vaccine(s):  n/a Shingrix:   Had 1 Hep C testing: n/a Screening Labs: PCP   reports that she has quit smoking. She has never used smokeless tobacco. She reports that she does not drink alcohol or use drugs.  Past  Medical History:  Diagnosis Date  . Abnormal Pap smear of cervix    about age 59  . 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 Depression   . Endometriosis   . Family history of adverse reaction to anesthesia    " My MOM HAD PROBLEMS ,i DONTKNOW EXACTLY WHAT "  . Family history of breast cancer   . Family history of colon cancer   . Family history of melanoma   . Gallstones 07/2016  . Hyperlipidemia   . IBS (irritable bowel syndrome)     Past Surgical History:  Procedure Laterality Date  . APPENDECTOMY    . BACK SURGERY    . CERVICAL CONE BIOPSY     age 59  . CHOLECYSTECTOMY  08/17/2016  . CHOLECYSTECTOMY N/A 08/17/2016   Procedure: LAPAROSCOPIC CHOLECYSTECTOMY WITH INTRAOPERATIVE CHOLANGIOGRAM;  Surgeon: Donnie Mesa, MD;  Location: [place];  Service: General;  Laterality: N/A;  . ECTOPIC PREGNANCY SURGERY    . ERCP N/A 08/17/2016   Procedure: ENDOSCOPIC RETROGRADE CHOLANGIOPANCREATOGRAPHY (ERCP);  Surgeon: Clarene Essex, MD;  Location: [place];  Service: Endoscopy;  Laterality: N/A;  . LAPAROSCOPY    . SHOULDER SURGERY Bilateral   . TUBAL LIGATION      Current Outpatient Medications  Medication Sig Dispense Refill  . buPROPion (WELLBUTRIN XL) 150 MG 24 hr tablet Take 150 mg by mouth  every morning.    . Cholecalciferol (VITAMIN D) 50 MCG (2000 UT) CAPS Take by mouth daily.    . clonazePAM (KLONOPIN) 0.5 MG tablet clonazepam 0.5 mg tablet  TAKE 1/2 - 1 TABLET BY MOUTH TWICE DAILY    . FLUoxetine (PROZAC) 20 MG capsule TAKE 1 CAPSULE BY MOUTH EVERY MORNING WITH FOOD    . meloxicam (MOBIC) 15 MG tablet Take 1 tablet by mouth daily.    . traMADol (ULTRAM) 50 MG tablet Take 50 mg by mouth every 6 (six) hours as needed (pain).   0  . triamcinolone cream (KENALOG) 0.1 % APPLY 1 APPLICATION (TOPICAL) 2 TIMES PER DAY AS NEEDED   ITCHING    . VYVANSE 30 MG capsule Take 30 mg by mouth every morning.  0   No current facility-administered medications for this visit.      Family History  Problem Relation Age of Onset  . Prostate cancer Father        Living, 15  . Bladder Cancer Mother        Living, 10  . Hypertension Mother   . Aneurysm Mother        brain  . Colon cancer Maternal Grandfather        dx in his late 11s-50s  . Healthy Brother        x 2  . Healthy Daughter   . Heart attack Maternal Aunt   . Breast cancer Paternal Aunt        dx in her 72s, d. in her 38s  . Colon cancer Paternal Grandmother        dx in her 55s  . Melanoma Paternal Grandmother        dx in her 38s  . Leukemia Paternal Grandfather        dx in her 31s  . Healthy Brother     Review of Systems  Genitourinary: Positive for dyspareunia.  All other systems reviewed and are negative.   Exam:   BP 118/80   Pulse 80   Temp (!) 97.3 F (36.3 C) (Temporal)   Ht '5\' 6"'  (1.676 m)   Wt 139 lb 3.2 oz (63.1 kg)   LMP 03/01/2016 (Approximate)   BMI 22.47 kg/m    Height: '5\' 6"'  (167.6 cm)  Ht Readings from Last 3 Encounters:  11/16/18 '5\' 6"'  (1.676 m)  12/14/17 5' 0.5" (1.537 m)  08/17/16 5' 0.5" (1.537 m)    General appearance: alert, cooperative and appears stated age Head: Normocephalic, without obvious abnormality, atraumatic Neck: no adenopathy, supple, symmetrical, trachea midline and thyroid normal to inspection and palpation Lungs: clear to auscultation bilaterally Breasts: normal appearance, no masses or tenderness Heart: regular rate and rhythm Abdomen: soft, non-tender; bowel sounds normal; no masses,  no organomegaly Extremities: extremities normal, atraumatic, no cyanosis or edema Skin: Skin color, texture, turgor normal. No rashes or lesions Lymph nodes: Cervical, supraclavicular, and axillary nodes normal. No abnormal inguinal nodes palpated Neurologic: Grossly normal   Pelvic: External genitalia:  no lesions              Urethra:  normal appearing urethra with no masses, tenderness or lesions              Bartholins and Skenes: normal                  Vagina: atrophic changes, no lesions              Cervix: no lesions  Pap taken: Yes.   Bimanual Exam:  Uterus:  normal size, contour, position, consistency, mobility, non-tender              Adnexa: normal adnexa and no mass, fullness, tenderness               Rectovaginal: Confirms               Anus:  normal sphincter tone, no lesions  Chaperone was present for exam.  A:  Well Woman with normal exam PMP, no HRT H/o ATM gene mutation with significant increased risk of breast cancer and possible increase risk of ovarian cancer Osteoporosis with t score -2.5  P:   Mammogram and MRI guidelines reviewed.  Just did MMG in July.  Will plan breast MRI in 03/2019.  Reminder placed. pap smear with HR HPV obtained today Release of records for colonoscopy, vaccines and recent lab work to make sure TSH, CMP and Vit D have been done PTH with intact calcium obtained Return annually or prn

## 2018-11-17 LAB — PTH, INTACT AND CALCIUM
Calcium: 9.7 mg/dL (ref 8.7–10.2)
PTH: 33 pg/mL (ref 15–65)

## 2018-11-27 ENCOUNTER — Telehealth: Payer: Self-pay | Admitting: Obstetrics & Gynecology

## 2018-11-27 LAB — CYTOLOGY - PAP
Diagnosis: NEGATIVE
High risk HPV: NEGATIVE
Molecular Disclaimer: 56
Molecular Disclaimer: NORMAL

## 2018-11-27 NOTE — Telephone Encounter (Signed)
Spoke with patient.   1. Requesting 11/16/18 lab and pap results. Advised PTH and Calcium WNL.  Advised pap: - Negative for intraepithelial lesion or malignancy (NILM) MOLECULAR RESULTS: HPV High Risk Not Detected  2. Patient is requesting Rx for gabapentin and Vit E suppositories. Also request Rx recommendations for osteoporosis.    Advised I will forward to Dr. Sabra Heck to review, our office will return call with recommendations. Patient is agreeable.  Dr. Sabra Heck -please advise.

## 2018-11-27 NOTE — Telephone Encounter (Signed)
Patient calling to check status of gabapentin and vitamin e suppositories prescription called into pharmacy. Patient also has questions about recent lab results. Please advise.   [facility] in [place]

## 2018-11-28 DIAGNOSIS — M542 Cervicalgia: Secondary | ICD-10-CM | POA: Diagnosis not present

## 2018-11-29 ENCOUNTER — Other Ambulatory Visit: Payer: Self-pay | Admitting: Obstetrics & Gynecology

## 2018-11-29 MED ORDER — GABAPENTIN 100 MG PO CAPS: ORAL_CAPSULE | ORAL | 1 refills | Status: DC

## 2018-11-29 MED ORDER — ALENDRONATE SODIUM 70 MG PO TABS: 70.0000 mg | ORAL_TABLET | ORAL | 3 refills | Status: DC

## 2018-11-29 MED ORDER — NONFORMULARY OR COMPOUNDED ITEM: 3 refills | Status: AC

## 2018-11-29 NOTE — Telephone Encounter (Signed)
Please let pt know her pap was norma and HR HPV was negative.  PTH and calcium were normal.  I think we discussed fosamax as starting therapy as failing this or having side effects is usually needed to proceed with other therapies.  Review taking medication with her:  70mg  week.  Take in am on empty stomach with 4 oz water and be upright for at least 30 minutes before eating or drinking anything else.  Rx for gabapentin to pharmacy.  She will take 100mg  x 3 nights, then 200mg  x 3 nights, then 300mg .  She needs to give update once she's been on the 300mg  dosage for 3 nights.  Vit e suppositories [facility]  They will call her when ready.  Needs recheck 3 months to assess any vaginal improvement, any side effects from Fosamax and to make additional plans.

## 2018-11-30 NOTE — Telephone Encounter (Signed)
Spoke with patient, advised per Dr. Sabra Heck. OV scheduled for 03/06/19 at 3:30pm with Dr. Sabra Heck. Patient verbalizes understating and is agreeable.   Encounter closed.

## 2018-12-05 DIAGNOSIS — R519 Headache, unspecified: Secondary | ICD-10-CM | POA: Diagnosis not present

## 2018-12-14 DIAGNOSIS — Z23 Encounter for immunization: Secondary | ICD-10-CM | POA: Diagnosis not present

## 2018-12-19 DIAGNOSIS — R5383 Other fatigue: Secondary | ICD-10-CM | POA: Diagnosis not present

## 2018-12-22 DIAGNOSIS — Z20828 Contact with and (suspected) exposure to other viral communicable diseases: Secondary | ICD-10-CM | POA: Diagnosis not present

## 2018-12-22 DIAGNOSIS — Z6822 Body mass index (BMI) 22.0-22.9, adult: Secondary | ICD-10-CM | POA: Diagnosis not present

## 2018-12-28 DIAGNOSIS — M542 Cervicalgia: Secondary | ICD-10-CM | POA: Diagnosis not present

## 2019-01-04 DIAGNOSIS — F39 Unspecified mood [affective] disorder: Secondary | ICD-10-CM | POA: Diagnosis not present

## 2019-01-04 DIAGNOSIS — F418 Other specified anxiety disorders: Secondary | ICD-10-CM | POA: Diagnosis not present

## 2019-01-04 DIAGNOSIS — F900 Attention-deficit hyperactivity disorder, predominantly inattentive type: Secondary | ICD-10-CM | POA: Diagnosis not present

## 2019-01-04 DIAGNOSIS — F341 Dysthymic disorder: Secondary | ICD-10-CM | POA: Diagnosis not present

## 2019-01-05 DIAGNOSIS — R51 Headache with orthostatic component, not elsewhere classified: Secondary | ICD-10-CM | POA: Diagnosis not present

## 2019-01-10 DIAGNOSIS — M810 Age-related osteoporosis without current pathological fracture: Secondary | ICD-10-CM | POA: Diagnosis not present

## 2019-01-10 DIAGNOSIS — M542 Cervicalgia: Secondary | ICD-10-CM | POA: Diagnosis not present

## 2019-01-10 DIAGNOSIS — M5412 Radiculopathy, cervical region: Secondary | ICD-10-CM | POA: Diagnosis not present

## 2019-01-10 DIAGNOSIS — M47812 Spondylosis without myelopathy or radiculopathy, cervical region: Secondary | ICD-10-CM | POA: Diagnosis not present

## 2019-01-19 DIAGNOSIS — R5383 Other fatigue: Secondary | ICD-10-CM | POA: Diagnosis not present

## 2019-01-20 ENCOUNTER — Encounter: Payer: Self-pay | Admitting: Obstetrics & Gynecology

## 2019-02-06 DIAGNOSIS — Z20828 Contact with and (suspected) exposure to other viral communicable diseases: Secondary | ICD-10-CM | POA: Diagnosis not present

## 2019-02-16 ENCOUNTER — Other Ambulatory Visit: Payer: Self-pay | Admitting: Obstetrics & Gynecology

## 2019-02-16 NOTE — Telephone Encounter (Signed)
Medication refill request: Fosamax  Last AEX:  11-16-2018 SM 3 month follow up OV: 03-06-19 Last MMG (if hormonal medication request): n/a Refill authorized: Today, please advise.  Medication pended for #3, 0RF. Please refill if appropriate.

## 2019-02-28 DIAGNOSIS — F900 Attention-deficit hyperactivity disorder, predominantly inattentive type: Secondary | ICD-10-CM | POA: Diagnosis not present

## 2019-02-28 DIAGNOSIS — F331 Major depressive disorder, recurrent, moderate: Secondary | ICD-10-CM | POA: Diagnosis not present

## 2019-03-06 ENCOUNTER — Encounter: Payer: Self-pay | Admitting: Obstetrics & Gynecology

## 2019-03-06 ENCOUNTER — Other Ambulatory Visit: Payer: Self-pay

## 2019-03-06 ENCOUNTER — Ambulatory Visit (INDEPENDENT_AMBULATORY_CARE_PROVIDER_SITE_OTHER): Payer: BLUE CROSS/BLUE SHIELD | Admitting: Obstetrics & Gynecology

## 2019-03-06 VITALS — BP 110/62 | HR 84 | Temp 97.5°F | Ht 66.0 in | Wt 140.8 lb

## 2019-03-06 DIAGNOSIS — N952 Postmenopausal atrophic vaginitis: Secondary | ICD-10-CM | POA: Diagnosis not present

## 2019-03-06 DIAGNOSIS — Z1501 Genetic susceptibility to malignant neoplasm of breast: Secondary | ICD-10-CM

## 2019-03-06 DIAGNOSIS — Z658 Other specified problems related to psychosocial circumstances: Secondary | ICD-10-CM | POA: Diagnosis not present

## 2019-03-06 DIAGNOSIS — Z1589 Genetic susceptibility to other disease: Secondary | ICD-10-CM

## 2019-03-06 DIAGNOSIS — Z1509 Genetic susceptibility to other malignant neoplasm: Secondary | ICD-10-CM

## 2019-03-06 DIAGNOSIS — Z9189 Other specified personal risk factors, not elsewhere classified: Secondary | ICD-10-CM | POA: Diagnosis not present

## 2019-03-06 NOTE — Progress Notes (Signed)
GYNECOLOGY  VISIT  CC:   Patient states that vitamin E suppositories seem to help  HPI: 54 y.o. G30P1021 Married White or Caucasian female here for 3 month med f/u.  Has not been using the Vit E vaginal suppositories.  Having to help family.  She did use the suppositories for about a month and she was able to be sexually active so feels she can have more improvement, just needs to actually use them.  Discussed any barriers that might exist but there don't seem to be any other than just having a lot of additional stressors.  Feels is she is scheduled for follow up and knows we will discuss this again, that may help.    She is seeing a psychologist, Remo Lipps, and is in the same office as Dr. Baxter Flattery at [facility].  She has seen Remo Lipps in the past but with family issues/concerns, felt restarting that form of care would be beneficial.  Does have some questions about possibly using Paxil or Zoloft for treatment of hot flashes.  She has weaned off Wellbutrin and feels this is not something she needs at this point.  Asks if I can check with Dr.Snider.    Lastly, she does need breast MRI scheduled.  Has MMG 08/2018.  Having this alternating with MRI every six months.  Has ATM gene mutation and increased risks for breast cancer.  Did see oncology for consultation with Dr.   Phill Myron HISTORY: Patient's last menstrual period was 03/01/2016 (approximate). Contraception: Postmenopausal Menopausal hormone therapy: none  Patient Active Problem List   Diagnosis Date Noted  . Family history of colon cancer   . Family history of breast cancer   . Family history of melanoma   . Monoallelic mutation of ATM gene 12/14/2017  . Breast cancer screening, high risk patient 12/14/2017  . Lumbar disc herniation with radiculopathy 10/26/2017  . Lumbosacral spondylosis 10/26/2017  . Acute gallstone pancreatitis 08/15/2016  . Hyperlipidemia 08/15/2016  . Depression 08/15/2016  . Abnormal LFTs 08/15/2016  . Liver mass  08/15/2016    Past Medical History:  Diagnosis Date  . Abnormal Pap smear of cervix    about age 45  . Depression   . Endometriosis   . Family history of adverse reaction to anesthesia    "Mom had problems, no exactly sure what"  . Family history of breast cancer   . Family history of colon cancer   . Family history of melanoma   . Gallstones 07/2016  . Hyperlipidemia   . IBS (irritable bowel syndrome)   . Increased risk of breast cancer    atm gene mutation, yearly MRIs  . Liver hemangioma   . Osteopenia     Past Surgical History:  Procedure Laterality Date  . APPENDECTOMY     as a teen  . CERVICAL CONE BIOPSY  1991  . CHOLECYSTECTOMY N/A 08/17/2016   Procedure: LAPAROSCOPIC CHOLECYSTECTOMY WITH INTRAOPERATIVE CHOLANGIOGRAM;  Surgeon: Donnie Mesa, MD;  Location: [facility];  Service: General;  Laterality: N/A;  . ECTOPIC PREGNANCY SURGERY Right 1990  . ERCP N/A 08/17/2016   Procedure: ENDOSCOPIC RETROGRADE CHOLANGIOPANCREATOGRAPHY (ERCP);  Surgeon: Clarene Essex, MD;  Location: [facility];  Service: Endoscopy;  Laterality: N/A;  . LAMINECTOMY AND MICRODISCECTOMY LUMBAR SPINE  2016   L3-L4  . LAPAROSCOPY     multiple done due to endometriosis--1991, 1998, 2006, 2010  . SHOULDER SURGERY Bilateral   . TUBAL LIGATION  07/2006    MEDS:   Current Outpatient Medications on File  Prior to Visit  Medication Sig Dispense Refill  . alendronate (FOSAMAX) 70 MG tablet TAKE 1 TABLET BY MOUTH EVERY 7 DAYS TAKE FIRST THING IN THE MORNING WITH 6 OUNCES OF WATER 12 tablet 3  . Cholecalciferol (VITAMIN D) 50 MCG (2000 UT) CAPS Take by mouth daily.    . clonazePAM (KLONOPIN) 0.5 MG tablet clonazepam 0.5 mg tablet  TAKE 1/2 - 1 TABLET BY MOUTH TWICE DAILY    . FLUoxetine (PROZAC) 20 MG capsule TAKE 1 CAPSULE BY MOUTH EVERY MORNING WITH FOOD    . meloxicam (MOBIC) 15 MG tablet Take 1 tablet by mouth daily.    . NONFORMULARY OR COMPOUNDED ITEM Vitamin E vaginal suppositories 200u/ml.  One pv three  times weekly. 36 each 3  . traMADol (ULTRAM) 50 MG tablet Take 50 mg by mouth every 6 (six) hours as needed (pain).   0  . VYVANSE 30 MG capsule Take 30 mg by mouth every morning.  0   No current facility-administered medications on file prior to visit.    ALLERGIES: Patient has no known allergies.  Family History  Problem Relation Age of Onset  . Prostate cancer Father        Living, 74  . Bladder Cancer Mother        Living, 10  . Hypertension Mother   . Aneurysm Mother        brain  . Colon cancer Maternal Grandfather        dx in his late 109s-50s  . Healthy Brother        x 2  . Healthy Daughter   . Heart attack Maternal Aunt   . Breast cancer Paternal Aunt        dx in her 59s, d. in her 28s  . Colon cancer Paternal Grandmother        dx in her 70s  . Melanoma Paternal Grandmother        dx in her 19s  . Leukemia Paternal Grandfather        dx in her 47s  . Healthy Brother     SH:  Married, non smoker  Review of Systems  All other systems reviewed and are negative.   PHYSICAL EXAMINATION:    BP 110/62 (BP Location: Right Arm, Patient Position: Sitting, Cuff Size: Normal)   Pulse 84   Temp (!) 97.5 F (36.4 C) (Temporal)   Ht '5\' 6"'  (1.676 m)   Wt 140 lb 12.8 oz (63.9 kg)   LMP 03/01/2016 (Approximate)   BMI 22.73 kg/m     General appearance: alert, cooperative and appears stated age No physical exam performed.  Assessment: Vaginal atrophic changes ATM gene mutation with increased risks for breast cancer, aware I do not think she should use estrogens  Plan: Pt has RX (and RFs) for vit e vaginal suppositories to use two to three times weekly.  Will plan recheck 3-4 months. Will reach out to [facility] about whether pt needs appt with Dr. Baxter Flattery Breast MRI will be scheduled for pt.   ~15 minutes spent with patient >50% of time was in face to face discussion of above.

## 2019-03-07 ENCOUNTER — Telehealth: Payer: Self-pay | Admitting: *Deleted

## 2019-03-07 DIAGNOSIS — Z803 Family history of malignant neoplasm of breast: Secondary | ICD-10-CM

## 2019-03-07 DIAGNOSIS — Z1501 Genetic susceptibility to malignant neoplasm of breast: Secondary | ICD-10-CM

## 2019-03-07 DIAGNOSIS — Z1509 Genetic susceptibility to other malignant neoplasm: Secondary | ICD-10-CM

## 2019-03-07 DIAGNOSIS — Z1589 Genetic susceptibility to other disease: Secondary | ICD-10-CM

## 2019-03-07 DIAGNOSIS — Z9189 Other specified personal risk factors, not elsewhere classified: Secondary | ICD-10-CM | POA: Insufficient documentation

## 2019-03-07 DIAGNOSIS — Z1239 Encounter for other screening for malignant neoplasm of breast: Secondary | ICD-10-CM

## 2019-03-07 NOTE — Telephone Encounter (Signed)
-----  Message from Megan Salon, MD sent at 03/06/2019  3:51 PM EST ----- Regarding: breast MRI Victoria Weiss This pt has a ATM gene mutation with significant increased risk of breast cancer and is having yealry breast MRI.  She needs this scheduled in January.  Can you precert?  Also, with her last MRI in 2019, she had nausea with the contrast.  Can you check with [facility] to see if they recommend anything for her to take prior to receiving the contrast.  I am happy to write the prescription for the patient if they have any suggestions.    Thanks.  Vinnie Level

## 2019-03-07 NOTE — Telephone Encounter (Signed)
Order placed for MRI breast bilateral w/wo contrast.   Spoke with Dr. Melanee Spry at [facility]. Was advised contrast has changed since previous MRI in 2019. MutiHance was previously used and this did increase nausea in patients. They now use Gadavisit and patients seem to tolerate this better. Zofran would also be appropriate if provider chooses.   Routing to Dr. Sabra Heck to review.

## 2019-03-07 NOTE — Telephone Encounter (Signed)
Can you please let pt know this information but that I did sent in a prescription for Zofran for her to take one hour prior to the MRI if she desires?  Also, she wanted me to check with her psychiatrist about a question regarding her medication.  I called the office and they advised she call the office for appt/video visit?  Thanks.

## 2019-03-08 DIAGNOSIS — F900 Attention-deficit hyperactivity disorder, predominantly inattentive type: Secondary | ICD-10-CM | POA: Diagnosis not present

## 2019-03-08 DIAGNOSIS — F341 Dysthymic disorder: Secondary | ICD-10-CM | POA: Diagnosis not present

## 2019-03-08 DIAGNOSIS — F331 Major depressive disorder, recurrent, moderate: Secondary | ICD-10-CM | POA: Diagnosis not present

## 2019-03-08 NOTE — Telephone Encounter (Signed)
Spoke with patient. Advised as see below per Dr. Sabra Heck. Advised patient that breast MRI order placed, [facility] will contact you directly to schedule. Once imaging is scheduled our office will be notified, the business office will then precert. Patient verbalizes understanding and is agreeable.   Routing to provider for final review. Patient is agreeable to disposition. Will close encounter.  Cc: Lerry Liner, Magdalene Patricia

## 2019-03-12 DIAGNOSIS — S83241A Other tear of medial meniscus, current injury, right knee, initial encounter: Secondary | ICD-10-CM | POA: Diagnosis not present

## 2019-03-13 DIAGNOSIS — F900 Attention-deficit hyperactivity disorder, predominantly inattentive type: Secondary | ICD-10-CM | POA: Diagnosis not present

## 2019-03-13 DIAGNOSIS — F418 Other specified anxiety disorders: Secondary | ICD-10-CM | POA: Diagnosis not present

## 2019-03-13 DIAGNOSIS — F331 Major depressive disorder, recurrent, moderate: Secondary | ICD-10-CM | POA: Diagnosis not present

## 2019-03-20 DIAGNOSIS — F900 Attention-deficit hyperactivity disorder, predominantly inattentive type: Secondary | ICD-10-CM | POA: Diagnosis not present

## 2019-03-20 DIAGNOSIS — F418 Other specified anxiety disorders: Secondary | ICD-10-CM | POA: Diagnosis not present

## 2019-03-20 DIAGNOSIS — F39 Unspecified mood [affective] disorder: Secondary | ICD-10-CM | POA: Diagnosis not present

## 2019-03-21 ENCOUNTER — Telehealth: Payer: Self-pay | Admitting: Obstetrics & Gynecology

## 2019-03-21 NOTE — Telephone Encounter (Signed)
She was supposed to reach out to her psychiatrist about possible changes.  I do not feel comfortable with changing or adding to her medications and I've already called the office.  Has she contacted her psychiatrist?

## 2019-03-21 NOTE — Telephone Encounter (Signed)
Patient would like to try one of the antidepressants that Dr.Miller had recommended for her menopause. She cannot remember the names of the medications?

## 2019-03-21 NOTE — Telephone Encounter (Signed)
Patient was seen in office on 03/06/19. Per review of OV notes will reach out to [facility] about whether appt is needed.   Routing to Dr. Sabra Heck to review and advise on Rx.

## 2019-03-22 DIAGNOSIS — F341 Dysthymic disorder: Secondary | ICD-10-CM | POA: Diagnosis not present

## 2019-03-22 DIAGNOSIS — F418 Other specified anxiety disorders: Secondary | ICD-10-CM | POA: Diagnosis not present

## 2019-03-22 DIAGNOSIS — F331 Major depressive disorder, recurrent, moderate: Secondary | ICD-10-CM | POA: Diagnosis not present

## 2019-03-22 DIAGNOSIS — F900 Attention-deficit hyperactivity disorder, predominantly inattentive type: Secondary | ICD-10-CM | POA: Diagnosis not present

## 2019-03-22 NOTE — Telephone Encounter (Signed)
Spoke with patient, advised as seen below per Dr. Miller. Patient verbalizes understanding and is agreeable.   Encounter closed.  

## 2019-03-22 NOTE — Telephone Encounter (Signed)
Paxil and Effexor are the SSRI and SNRI that help hot flashes the most in clinical studies.  She is already on prozac which is an SSRI.  I think she will need to switch from this to one of the others and this is where I'd like her psychiatrist's input.  Prozac does not seem to be beneficial for hot flashes in clinical studies.

## 2019-03-22 NOTE — Telephone Encounter (Signed)
Spoke with patient. Patient states she did she her phychiatrist on 03/21/19, but forgot the manes of medications that were suggested to her. Patient states there were 2 medications. She will then review those options with her psychiatrist.  Advised I will review with Dr. Sabra Heck and return call.   Routing to Dr. Sabra Heck to advise.

## 2019-03-23 DIAGNOSIS — M542 Cervicalgia: Secondary | ICD-10-CM | POA: Diagnosis not present

## 2019-03-27 DIAGNOSIS — M542 Cervicalgia: Secondary | ICD-10-CM | POA: Diagnosis not present

## 2019-03-28 DIAGNOSIS — M542 Cervicalgia: Secondary | ICD-10-CM | POA: Diagnosis not present

## 2019-04-04 DIAGNOSIS — M542 Cervicalgia: Secondary | ICD-10-CM | POA: Diagnosis not present

## 2019-04-07 ENCOUNTER — Ambulatory Visit
Admission: RE | Admit: 2019-04-07 | Discharge: 2019-04-07 | Disposition: A | Discharge status: Home / Self Care | Payer: BC Managed Care – PPO | Source: Ambulatory Visit | Attending: Obstetrics & Gynecology | Admitting: Obstetrics & Gynecology

## 2019-04-07 ENCOUNTER — Other Ambulatory Visit: Payer: Self-pay

## 2019-04-07 DIAGNOSIS — N6489 Other specified disorders of breast: Secondary | ICD-10-CM | POA: Diagnosis not present

## 2019-04-07 DIAGNOSIS — Z803 Family history of malignant neoplasm of breast: Secondary | ICD-10-CM

## 2019-04-07 DIAGNOSIS — Z1501 Genetic susceptibility to malignant neoplasm of breast: Secondary | ICD-10-CM

## 2019-04-07 DIAGNOSIS — Z1509 Genetic susceptibility to other malignant neoplasm: Secondary | ICD-10-CM

## 2019-04-07 DIAGNOSIS — Z1239 Encounter for other screening for malignant neoplasm of breast: Secondary | ICD-10-CM

## 2019-04-07 DIAGNOSIS — Z1589 Genetic susceptibility to other disease: Secondary | ICD-10-CM

## 2019-04-07 MED ORDER — GADOBUTROL 1 MMOL/ML IV SOLN
6.0000 mL | Freq: Once | INTRAVENOUS | Status: AC | PRN
  Administered 2019-04-07: 6 mL via INTRAVENOUS

## 2019-04-11 DIAGNOSIS — M542 Cervicalgia: Secondary | ICD-10-CM | POA: Diagnosis not present

## 2019-04-13 ENCOUNTER — Telehealth: Payer: Self-pay | Admitting: *Deleted
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
## 2019-04-13 NOTE — Telephone Encounter (Signed)
Burnice Logan, RN  04/13/2019 8:24 AM EST    Left message to call Sharee Pimple, RN at [facility].   Patient removed from IM[facility] hold.

## 2019-04-13 NOTE — Telephone Encounter (Signed)
Spoke with patient, advised per Dr. Miller. Patient verbalizes understanding and is agreeable. Encounter closed.  

## 2019-04-13 NOTE — Telephone Encounter (Signed)
-----   Message from Megan Salon, MD sent at 04/13/2019  6:09 AM EST ----- Please let pt know her breast MRI was negative for abnormal findings.  She needs MMG in July and does not need an order from me to schedule.  She can just call.  I've placed a reminder to myself for next year about repeating her breast MRI.  Out of imaging hold.

## 2019-04-18 DIAGNOSIS — M542 Cervicalgia: Secondary | ICD-10-CM | POA: Diagnosis not present

## 2019-04-18 DIAGNOSIS — M2550 Pain in unspecified joint: Secondary | ICD-10-CM | POA: Diagnosis not present

## 2019-05-01 DIAGNOSIS — Z20822 Contact with and (suspected) exposure to covid-19: Secondary | ICD-10-CM | POA: Diagnosis not present

## 2019-05-22 ENCOUNTER — Encounter: Payer: Self-pay | Admitting: Obstetrics & Gynecology

## 2019-06-21 DIAGNOSIS — F39 Unspecified mood [affective] disorder: Secondary | ICD-10-CM | POA: Diagnosis not present

## 2019-06-21 DIAGNOSIS — F900 Attention-deficit hyperactivity disorder, predominantly inattentive type: Secondary | ICD-10-CM | POA: Diagnosis not present

## 2019-06-21 DIAGNOSIS — F418 Other specified anxiety disorders: Secondary | ICD-10-CM | POA: Diagnosis not present

## 2019-06-29 NOTE — Progress Notes (Signed)
GYNECOLOGY  VISIT  CC:   Recheck, atrophic vaginitis  HPI: 54 y.o. G36P1021 Married White or Caucasian female here for follow up of vaginal atrophy.  she has hx of ATM gene mutation and is trying to decrease risks of breast cancer as much as possible.  She is doing yearly MRI and MMG every six months.  With menopause, intercoures became very painful.  She has done [employer] without any success.  She as started on vaginal Vit E and is here for follow up.  Reports intercourse is not "great" but tolerable and that is much better than it was before.  Denies vaginal bleeding or spotting after intercourse.  All options for treatment of atrophic changes discussed individually.  Decided to continue with current regimen.    She does a lot of reading and sent me an article about a clinical trial regarding platelet donation and increased citrate resulting in increased osteoporosis.  She is a very regular platelet donator.  D/w pt possibility to changing to donating blood instead of platelets because whole blood donation does not need to be washed and replaced and, therefore, citrate isn't needed.  She is going to consider.  GYNECOLOGIC HISTORY: Patient's last menstrual period was 03/01/2016 (approximate). Contraception: post menopausal Menopausal hormone therapy: none  Patient Active Problem List   Diagnosis Date Noted  . Increased risk of breast cancer 03/07/2019  . Family history of colon cancer   . Family history of breast cancer   . Family history of melanoma   . Monoallelic mutation of ATM gene 12/14/2017  . Breast cancer screening, high risk patient 12/14/2017  . Lumbar disc herniation with radiculopathy 10/26/2017  . Lumbosacral spondylosis 10/26/2017  . Acute gallstone pancreatitis 08/15/2016  . Hyperlipidemia 08/15/2016  . Depression 08/15/2016  . Abnormal LFTs 08/15/2016  . Liver mass 08/15/2016    Past Medical History:  Diagnosis Date  . Abnormal Pap smear of cervix    about  age 48  . Depression   . Endometriosis   . Family history of adverse reaction to anesthesia    "Mom had problems, no exactly sure what"  . Family history of breast cancer   . Family history of colon cancer   . Family history of melanoma   . Gallstones 07/2016  . Hyperlipidemia   . IBS (irritable bowel syndrome)   . Increased risk of breast cancer    atm gene mutation, yearly MRIs  . Liver hemangioma   . Osteopenia     Past Surgical History:  Procedure Laterality Date  . APPENDECTOMY     as a teen  . CERVICAL CONE BIOPSY  1991  . CHOLECYSTECTOMY N/A 08/17/2016   Procedure: LAPAROSCOPIC CHOLECYSTECTOMY WITH INTRAOPERATIVE CHOLANGIOGRAM;  Surgeon: Donnie Mesa, MD;  Location: [facility];  Service: General;  Laterality: N/A;  . ECTOPIC PREGNANCY SURGERY Right 1990  . ERCP N/A 08/17/2016   Procedure: ENDOSCOPIC RETROGRADE CHOLANGIOPANCREATOGRAPHY (ERCP);  Surgeon: Clarene Essex, MD;  Location: [facility];  Service: Endoscopy;  Laterality: N/A;  . LAMINECTOMY AND MICRODISCECTOMY LUMBAR SPINE  2016   L3-L4  . LAPAROSCOPY     multiple done due to endometriosis--1991, 1998, 2006, 2010  . SHOULDER SURGERY Bilateral   . TUBAL LIGATION  07/2006    MEDS:   Current Outpatient Medications on File Prior to Visit  Medication Sig Dispense Refill  . alendronate (FOSAMAX) 70 MG tablet TAKE 1 TABLET BY MOUTH EVERY 7 DAYS TAKE FIRST THING IN THE MORNING WITH 6 OUNCES OF WATER  12 tablet 3  . buPROPion (WELLBUTRIN XL) 150 MG 24 hr tablet Take 150 mg by mouth every morning.    . Cholecalciferol (VITAMIN D) 50 MCG (2000 UT) CAPS Take by mouth daily.    . clonazePAM (KLONOPIN) 0.5 MG tablet clonazepam 0.5 mg tablet  TAKE 1/2 - 1 TABLET BY MOUTH TWICE DAILY    . FLUoxetine (PROZAC) 20 MG capsule TAKE 1 CAPSULE BY MOUTH EVERY MORNING WITH FOOD    . meloxicam (MOBIC) 15 MG tablet Take 1 tablet by mouth daily.    . NONFORMULARY OR COMPOUNDED ITEM Vitamin E vaginal suppositories 200u/ml.  One pv three times weekly.  36 each 3  . traMADol (ULTRAM) 50 MG tablet Take 50 mg by mouth every 6 (six) hours as needed (pain).   0  . VYVANSE 40 MG capsule Take by mouth every morning.    . propranolol (INDERAL) 10 MG tablet Take 10 mg by mouth 2 (two) times daily as needed.     No current facility-administered medications on file prior to visit.    ALLERGIES: Patient has no known allergies.  Family History  Problem Relation Age of Onset  . Prostate cancer Father        Living, 19  . Bladder Cancer Mother        Living, 56  . Hypertension Mother   . Aneurysm Mother        brain  . Colon cancer Maternal Grandfather        dx in his late 79s-50s  . Healthy Brother        x 2  . Healthy Daughter   . Heart attack Maternal Aunt   . Breast cancer Paternal Aunt        dx in her 1s, d. in her 33s  . Colon cancer Paternal Grandmother        dx in her 90s  . Melanoma Paternal Grandmother        dx in her 50s  . Leukemia Paternal Grandfather        dx in her 58s  . Healthy Brother     SH:  Married, non smoker  Review of Systems  Constitutional: Negative.   HENT: Negative.   Eyes: Negative.   Respiratory: Negative.   Cardiovascular: Negative.   Gastrointestinal: Negative.   Endocrine: Negative.   Genitourinary: Negative.   Musculoskeletal: Negative.   Skin: Negative.   Allergic/Immunologic: Negative.   Neurological: Negative.   Psychiatric/Behavioral: Negative.     PHYSICAL EXAMINATION:    BP 116/76   Pulse 68   Temp (!) 97.2 F (36.2 C) (Skin)   Resp 16   Wt 142 lb (64.4 kg)   LMP 03/01/2016 (Approximate)   BMI 22.92 kg/m     General appearance: alert, cooperative and appears stated age Lymph:  no inguinal LAD noted  Pelvic: External genitalia:  no lesions              Urethra:  normal appearing urethra with no masses, tenderness or lesions              Bartholins and Skenes: normal                 Vagina: improved vaginal mucosa with less atrophic tissue appearance, no bleeding  or discharge              Cervix: no lesions              Anus:  no lesions  Chaperone, Terence Lux, CMA, was present for exam.  Assessment: Atrophic vaginitis, improved Dyspareunia, improved H/o ATM gene mutation, no estrogen is preferred Frequent platelet donation Osteoporosis  Plan: Pt will continue with Vit E vaginal suppositories.  No new RX is needed.  She is using three times weekly. Pt will consider changing to blood donation from platelet donation. Repeat BMD due this summer.   ~25 minutes total spent with pt including discussion of options for treatment of vaginal atrophy/dyspareunia as well as concerns from article she read.

## 2019-07-04 ENCOUNTER — Other Ambulatory Visit: Payer: Self-pay

## 2019-07-05 ENCOUNTER — Encounter: Payer: Self-pay | Admitting: Obstetrics & Gynecology

## 2019-07-05 ENCOUNTER — Ambulatory Visit (INDEPENDENT_AMBULATORY_CARE_PROVIDER_SITE_OTHER): Payer: BC Managed Care – PPO | Admitting: Obstetrics & Gynecology

## 2019-07-05 ENCOUNTER — Other Ambulatory Visit: Payer: Self-pay

## 2019-07-05 VITALS — BP 116/76 | HR 68 | Temp 97.2°F | Resp 16 | Wt 142.0 lb

## 2019-07-05 DIAGNOSIS — M810 Age-related osteoporosis without current pathological fracture: Secondary | ICD-10-CM | POA: Diagnosis not present

## 2019-07-05 DIAGNOSIS — N952 Postmenopausal atrophic vaginitis: Secondary | ICD-10-CM | POA: Diagnosis not present

## 2019-07-05 DIAGNOSIS — Z9189 Other specified personal risk factors, not elsewhere classified: Secondary | ICD-10-CM | POA: Diagnosis not present

## 2019-07-05 NOTE — Patient Instructions (Signed)
[address], [place] [address] Phone: [phone] Fax: [phone]  Monday - Thursday 8:00 a.m. - 5:00 p.m.  Friday 7:30 a.m. - 5:00 p.m.

## 2019-07-06 ENCOUNTER — Other Ambulatory Visit: Payer: Self-pay | Admitting: Neurosurgery

## 2019-07-06 DIAGNOSIS — M542 Cervicalgia: Secondary | ICD-10-CM

## 2019-07-12 ENCOUNTER — Other Ambulatory Visit: Payer: Self-pay

## 2019-07-12 ENCOUNTER — Encounter: Payer: Self-pay | Admitting: Obstetrics & Gynecology

## 2019-07-12 ENCOUNTER — Ambulatory Visit (INDEPENDENT_AMBULATORY_CARE_PROVIDER_SITE_OTHER): Payer: BC Managed Care – PPO | Admitting: Podiatry

## 2019-07-12 ENCOUNTER — Ambulatory Visit (INDEPENDENT_AMBULATORY_CARE_PROVIDER_SITE_OTHER): Payer: BC Managed Care – PPO

## 2019-07-12 ENCOUNTER — Telehealth: Payer: Self-pay | Admitting: Obstetrics & Gynecology

## 2019-07-12 ENCOUNTER — Other Ambulatory Visit: Payer: Self-pay | Admitting: Podiatry

## 2019-07-12 DIAGNOSIS — G5792 Unspecified mononeuropathy of left lower limb: Secondary | ICD-10-CM

## 2019-07-12 DIAGNOSIS — G5791 Unspecified mononeuropathy of right lower limb: Secondary | ICD-10-CM | POA: Diagnosis not present

## 2019-07-12 DIAGNOSIS — G629 Polyneuropathy, unspecified: Secondary | ICD-10-CM

## 2019-07-12 DIAGNOSIS — M79672 Pain in left foot: Secondary | ICD-10-CM | POA: Diagnosis not present

## 2019-07-12 DIAGNOSIS — M79671 Pain in right foot: Secondary | ICD-10-CM | POA: Diagnosis not present

## 2019-07-12 MED ORDER — GABAPENTIN 300 MG PO CAPS: 300.0000 mg | ORAL_CAPSULE | Freq: Every day | ORAL | 3 refills | Status: AC

## 2019-07-12 NOTE — Telephone Encounter (Signed)
AEX 10/2018 OV for vaginal atrophy 07/05/19.  Spoke with pt. Pt requesting name of physician that she was asking about for alternative hormone replacement therapy.  Advised will review with Dr Sabra Heck and return call to pt. Pt agreeable.   Routing to Dr Sabra Heck.

## 2019-07-12 NOTE — Telephone Encounter (Signed)
Visit Follow-Up Question Received: Today Message Contents  Victoria Weiss, Victoria Weiss sent to [facility]  Phone Number: [phone]  Hi Dr. Sabra Heck,  Just checking in to see if you figured out the patient's name and the name of her doctor that I was inquiring g about possibly seeing.  Thank you!  Otila Back

## 2019-07-13 NOTE — Progress Notes (Signed)
Subjective:   Patient ID: Victoria Weiss, female   DOB: 54 y.o.   MRN: [id number]   HPI Patient presents stating she has been getting progressive numbness on her feet for the last year and at times it bothers her at night and makes sleeping difficult.  She is not had any falls or has not had any weakness of muscles but just bothersome and is concerned about this.  She has had a history of back problems in the past but nothing recently   ROS      Objective:  Physical Exam  Vascular status found to be intact and neurological sharp dull was also found to be intact.  Did not have any muscle strength loss and good range of motion and no other pathology was noted upon evaluation     Assessment:  Possibility that we may be dealing with some form of idiopathic neuropathy or possibly something related to back issues     Plan:  H&P x-rays reviewed conditions discussed at great length.  I do think that we could consider nerve biopsies versus neurologist referral for this and also we will get a start her on nighttime dose of gabapentin to see if it makes a difference with her pain which is mostly concentrated at nighttime.  Patient will be seen back in 3 weeks for nerve biopsy and is encouraged to start the medicine and we will see whether or not this seems to make a difference for her  X-rays indicate no signs of structural malalignment no signs of osteoporosis or arthritis

## 2019-07-13 NOTE — Telephone Encounter (Signed)
The provider she and I discussed is actually a urology nurse practitioner.  Once I researched this individual, it didn't really make any sense to me about her specialty in women's health and hormonal therapy so I don't think this would be a good referral to place for her.  I have researched providers at [facility], [facility] and [facility] and just can't find what she specifically is asking for.  I'm sorry.

## 2019-07-16 NOTE — Telephone Encounter (Signed)
Spoke with pt. Pt given update from Dr Sabra Heck. Pt agreeable and verbalized understanding. Pt thankful for research Dr Sabra Heck did to help her.   Routing to Dr Sabra Heck  Encounter closed.

## 2019-08-02 ENCOUNTER — Other Ambulatory Visit: Payer: Self-pay

## 2019-08-02 ENCOUNTER — Ambulatory Visit
Admission: RE | Admit: 2019-08-02 | Discharge: 2019-08-02 | Disposition: A | Discharge status: Home / Self Care | Payer: BLUE CROSS/BLUE SHIELD | Source: Ambulatory Visit | Attending: Neurosurgery | Admitting: Neurosurgery

## 2019-08-02 DIAGNOSIS — M4802 Spinal stenosis, cervical region: Secondary | ICD-10-CM | POA: Diagnosis not present

## 2019-08-02 DIAGNOSIS — M542 Cervicalgia: Secondary | ICD-10-CM

## 2019-08-06 ENCOUNTER — Ambulatory Visit: Payer: BC Managed Care – PPO | Admitting: Podiatry

## 2019-08-21 DIAGNOSIS — F39 Unspecified mood [affective] disorder: Secondary | ICD-10-CM | POA: Diagnosis not present

## 2019-08-21 DIAGNOSIS — F418 Other specified anxiety disorders: Secondary | ICD-10-CM | POA: Diagnosis not present

## 2019-08-21 DIAGNOSIS — F900 Attention-deficit hyperactivity disorder, predominantly inattentive type: Secondary | ICD-10-CM | POA: Diagnosis not present

## 2019-08-30 DIAGNOSIS — Z1159 Encounter for screening for other viral diseases: Secondary | ICD-10-CM | POA: Diagnosis not present

## 2019-09-04 DIAGNOSIS — Z800 Family history of malignant neoplasm of digestive organs: Secondary | ICD-10-CM | POA: Diagnosis not present

## 2019-09-04 DIAGNOSIS — Z1211 Encounter for screening for malignant neoplasm of colon: Secondary | ICD-10-CM | POA: Diagnosis not present

## 2019-09-04 DIAGNOSIS — Z8371 Family history of colonic polyps: Secondary | ICD-10-CM | POA: Diagnosis not present

## 2019-10-17 DIAGNOSIS — M542 Cervicalgia: Secondary | ICD-10-CM | POA: Diagnosis not present

## 2019-10-24 DIAGNOSIS — Z79899 Other long term (current) drug therapy: Secondary | ICD-10-CM | POA: Diagnosis not present

## 2019-10-24 DIAGNOSIS — Z0000 Encounter for general adult medical examination without abnormal findings: Secondary | ICD-10-CM | POA: Diagnosis not present

## 2019-10-24 DIAGNOSIS — E559 Vitamin D deficiency, unspecified: Secondary | ICD-10-CM | POA: Diagnosis not present

## 2019-10-24 DIAGNOSIS — E785 Hyperlipidemia, unspecified: Secondary | ICD-10-CM | POA: Diagnosis not present

## 2019-10-26 DIAGNOSIS — G2581 Restless legs syndrome: Secondary | ICD-10-CM | POA: Diagnosis not present

## 2019-10-26 DIAGNOSIS — M542 Cervicalgia: Secondary | ICD-10-CM | POA: Diagnosis not present

## 2019-11-08 DIAGNOSIS — F341 Dysthymic disorder: Secondary | ICD-10-CM | POA: Diagnosis not present

## 2019-11-08 DIAGNOSIS — F900 Attention-deficit hyperactivity disorder, predominantly inattentive type: Secondary | ICD-10-CM | POA: Diagnosis not present

## 2019-11-13 DIAGNOSIS — M47812 Spondylosis without myelopathy or radiculopathy, cervical region: Secondary | ICD-10-CM | POA: Diagnosis not present

## 2019-12-30 DIAGNOSIS — Z23 Encounter for immunization: Secondary | ICD-10-CM | POA: Diagnosis not present

## 2020-02-14 DIAGNOSIS — F418 Other specified anxiety disorders: Secondary | ICD-10-CM | POA: Diagnosis not present

## 2020-02-14 DIAGNOSIS — F341 Dysthymic disorder: Secondary | ICD-10-CM | POA: Diagnosis not present

## 2020-02-14 DIAGNOSIS — F900 Attention-deficit hyperactivity disorder, predominantly inattentive type: Secondary | ICD-10-CM | POA: Diagnosis not present

## 2020-03-11 DIAGNOSIS — F341 Dysthymic disorder: Secondary | ICD-10-CM | POA: Diagnosis not present

## 2020-03-11 DIAGNOSIS — F331 Major depressive disorder, recurrent, moderate: Secondary | ICD-10-CM | POA: Diagnosis not present

## 2020-03-11 DIAGNOSIS — F900 Attention-deficit hyperactivity disorder, predominantly inattentive type: Secondary | ICD-10-CM | POA: Diagnosis not present

## 2020-03-11 DIAGNOSIS — F418 Other specified anxiety disorders: Secondary | ICD-10-CM | POA: Diagnosis not present

## 2020-03-14 ENCOUNTER — Other Ambulatory Visit: Payer: Self-pay | Admitting: Obstetrics & Gynecology

## 2020-03-28 ENCOUNTER — Telehealth: Payer: Self-pay | Admitting: Obstetrics & Gynecology

## 2020-03-28 ENCOUNTER — Encounter (HOSPITAL_BASED_OUTPATIENT_CLINIC_OR_DEPARTMENT_OTHER): Payer: Self-pay

## 2020-03-29 ENCOUNTER — Other Ambulatory Visit: Payer: Self-pay | Admitting: Obstetrics & Gynecology

## 2020-03-29 MED ORDER — ALENDRONATE SODIUM 70 MG PO TABS: ORAL_TABLET | ORAL | 3 refills | Status: DC

## 2020-03-31 ENCOUNTER — Other Ambulatory Visit: Payer: Self-pay | Admitting: Obstetrics & Gynecology

## 2020-04-01 DIAGNOSIS — F900 Attention-deficit hyperactivity disorder, predominantly inattentive type: Secondary | ICD-10-CM | POA: Diagnosis not present

## 2020-04-01 DIAGNOSIS — F341 Dysthymic disorder: Secondary | ICD-10-CM | POA: Diagnosis not present

## 2020-04-01 DIAGNOSIS — F331 Major depressive disorder, recurrent, moderate: Secondary | ICD-10-CM | POA: Diagnosis not present

## 2020-04-03 ENCOUNTER — Other Ambulatory Visit: Payer: Self-pay | Admitting: Obstetrics & Gynecology

## 2020-04-03 DIAGNOSIS — Z1231 Encounter for screening mammogram for malignant neoplasm of breast: Secondary | ICD-10-CM

## 2020-04-25 DIAGNOSIS — B009 Herpesviral infection, unspecified: Secondary | ICD-10-CM | POA: Diagnosis not present

## 2020-04-25 DIAGNOSIS — Z202 Contact with and (suspected) exposure to infections with a predominantly sexual mode of transmission: Secondary | ICD-10-CM | POA: Diagnosis not present

## 2020-05-22 NOTE — Telephone Encounter (Signed)
completed

## 2020-05-28 ENCOUNTER — Ambulatory Visit
Admission: RE | Admit: 2020-05-28 | Discharge: 2020-05-28 | Disposition: A | Discharge status: Home / Self Care | Payer: BC Managed Care – PPO | Source: Ambulatory Visit | Attending: Obstetrics & Gynecology | Admitting: Obstetrics & Gynecology

## 2020-05-28 ENCOUNTER — Other Ambulatory Visit: Payer: Self-pay

## 2020-05-28 DIAGNOSIS — Z1231 Encounter for screening mammogram for malignant neoplasm of breast: Secondary | ICD-10-CM | POA: Diagnosis not present

## 2020-06-05 ENCOUNTER — Encounter (HOSPITAL_BASED_OUTPATIENT_CLINIC_OR_DEPARTMENT_OTHER): Payer: Self-pay

## 2020-06-19 ENCOUNTER — Other Ambulatory Visit (HOSPITAL_BASED_OUTPATIENT_CLINIC_OR_DEPARTMENT_OTHER): Payer: Self-pay | Admitting: Obstetrics & Gynecology

## 2020-06-19 MED ORDER — ALENDRONATE SODIUM 70 MG PO TABS: ORAL_TABLET | ORAL | 1 refills | Status: AC

## 2020-07-10 ENCOUNTER — Ambulatory Visit (HOSPITAL_BASED_OUTPATIENT_CLINIC_OR_DEPARTMENT_OTHER): Payer: BC Managed Care – PPO | Admitting: Obstetrics & Gynecology

## 2020-07-21 ENCOUNTER — Ambulatory Visit (HOSPITAL_BASED_OUTPATIENT_CLINIC_OR_DEPARTMENT_OTHER): Payer: BC Managed Care – PPO | Admitting: Obstetrics & Gynecology

## 2020-08-05 DIAGNOSIS — F4323 Adjustment disorder with mixed anxiety and depressed mood: Secondary | ICD-10-CM | POA: Diagnosis not present

## 2020-08-12 DIAGNOSIS — F900 Attention-deficit hyperactivity disorder, predominantly inattentive type: Secondary | ICD-10-CM | POA: Diagnosis not present

## 2020-08-12 DIAGNOSIS — Z635 Disruption of family by separation and divorce: Secondary | ICD-10-CM | POA: Diagnosis not present

## 2020-08-12 DIAGNOSIS — F341 Dysthymic disorder: Secondary | ICD-10-CM | POA: Diagnosis not present

## 2020-08-27 DIAGNOSIS — F4323 Adjustment disorder with mixed anxiety and depressed mood: Secondary | ICD-10-CM | POA: Diagnosis not present

## 2020-09-04 DIAGNOSIS — F4323 Adjustment disorder with mixed anxiety and depressed mood: Secondary | ICD-10-CM | POA: Diagnosis not present

## 2020-11-20 DIAGNOSIS — F4321 Adjustment disorder with depressed mood: Secondary | ICD-10-CM | POA: Diagnosis not present

## 2020-11-24 DIAGNOSIS — G2581 Restless legs syndrome: Secondary | ICD-10-CM | POA: Diagnosis not present

## 2020-11-24 DIAGNOSIS — Z23 Encounter for immunization: Secondary | ICD-10-CM | POA: Diagnosis not present

## 2020-11-24 DIAGNOSIS — R7989 Other specified abnormal findings of blood chemistry: Secondary | ICD-10-CM | POA: Diagnosis not present

## 2020-11-24 DIAGNOSIS — Z01419 Encounter for gynecological examination (general) (routine) without abnormal findings: Secondary | ICD-10-CM | POA: Diagnosis not present

## 2020-11-24 DIAGNOSIS — Z0000 Encounter for general adult medical examination without abnormal findings: Secondary | ICD-10-CM | POA: Diagnosis not present

## 2020-11-24 DIAGNOSIS — Z13220 Encounter for screening for lipoid disorders: Secondary | ICD-10-CM | POA: Diagnosis not present

## 2020-12-05 DIAGNOSIS — M85852 Other specified disorders of bone density and structure, left thigh: Secondary | ICD-10-CM | POA: Diagnosis not present

## 2020-12-05 DIAGNOSIS — Z780 Asymptomatic menopausal state: Secondary | ICD-10-CM | POA: Diagnosis not present

## 2020-12-05 DIAGNOSIS — M85859 Other specified disorders of bone density and structure, unspecified thigh: Secondary | ICD-10-CM | POA: Diagnosis not present

## 2020-12-08 DIAGNOSIS — E2839 Other primary ovarian failure: Secondary | ICD-10-CM | POA: Diagnosis not present

## 2020-12-08 DIAGNOSIS — N898 Other specified noninflammatory disorders of vagina: Secondary | ICD-10-CM | POA: Diagnosis not present

## 2020-12-09 DIAGNOSIS — F4321 Adjustment disorder with depressed mood: Secondary | ICD-10-CM | POA: Diagnosis not present

## 2020-12-24 DIAGNOSIS — Z1509 Genetic susceptibility to other malignant neoplasm: Secondary | ICD-10-CM | POA: Diagnosis not present

## 2020-12-24 DIAGNOSIS — Z1501 Genetic susceptibility to malignant neoplasm of breast: Secondary | ICD-10-CM | POA: Diagnosis not present

## 2020-12-24 DIAGNOSIS — Z803 Family history of malignant neoplasm of breast: Secondary | ICD-10-CM | POA: Diagnosis not present

## 2020-12-30 DIAGNOSIS — F4321 Adjustment disorder with depressed mood: Secondary | ICD-10-CM | POA: Diagnosis not present

## 2021-01-01 DIAGNOSIS — R7989 Other specified abnormal findings of blood chemistry: Secondary | ICD-10-CM | POA: Diagnosis not present

## 2021-01-01 DIAGNOSIS — M8589 Other specified disorders of bone density and structure, multiple sites: Secondary | ICD-10-CM | POA: Diagnosis not present

## 2021-01-01 DIAGNOSIS — M150 Primary generalized (osteo)arthritis: Secondary | ICD-10-CM | POA: Diagnosis not present

## 2021-01-08 DIAGNOSIS — F4323 Adjustment disorder with mixed anxiety and depressed mood: Secondary | ICD-10-CM | POA: Diagnosis not present

## 2021-01-20 DIAGNOSIS — Z1589 Genetic susceptibility to other disease: Secondary | ICD-10-CM | POA: Diagnosis not present

## 2021-01-20 DIAGNOSIS — Z1509 Genetic susceptibility to other malignant neoplasm: Secondary | ICD-10-CM | POA: Diagnosis not present

## 2021-01-20 DIAGNOSIS — R922 Inconclusive mammogram: Secondary | ICD-10-CM | POA: Diagnosis not present

## 2021-01-20 DIAGNOSIS — R928 Other abnormal and inconclusive findings on diagnostic imaging of breast: Secondary | ICD-10-CM | POA: Diagnosis not present

## 2021-01-20 DIAGNOSIS — Z1501 Genetic susceptibility to malignant neoplasm of breast: Secondary | ICD-10-CM | POA: Diagnosis not present

## 2021-01-29 DIAGNOSIS — F39 Unspecified mood [affective] disorder: Secondary | ICD-10-CM | POA: Diagnosis not present

## 2021-01-29 DIAGNOSIS — Z635 Disruption of family by separation and divorce: Secondary | ICD-10-CM | POA: Diagnosis not present

## 2021-01-29 DIAGNOSIS — F418 Other specified anxiety disorders: Secondary | ICD-10-CM | POA: Diagnosis not present

## 2021-01-29 DIAGNOSIS — F900 Attention-deficit hyperactivity disorder, predominantly inattentive type: Secondary | ICD-10-CM | POA: Diagnosis not present

## 2021-02-04 DIAGNOSIS — E785 Hyperlipidemia, unspecified: Secondary | ICD-10-CM | POA: Diagnosis not present

## 2021-02-04 DIAGNOSIS — Z1509 Genetic susceptibility to other malignant neoplasm: Secondary | ICD-10-CM | POA: Diagnosis not present

## 2021-02-04 DIAGNOSIS — Z9189 Other specified personal risk factors, not elsewhere classified: Secondary | ICD-10-CM | POA: Diagnosis not present

## 2021-02-04 DIAGNOSIS — F419 Anxiety disorder, unspecified: Secondary | ICD-10-CM | POA: Diagnosis not present

## 2021-02-04 DIAGNOSIS — Z1501 Genetic susceptibility to malignant neoplasm of breast: Secondary | ICD-10-CM | POA: Diagnosis not present

## 2021-02-04 DIAGNOSIS — F32A Depression, unspecified: Secondary | ICD-10-CM | POA: Diagnosis not present

## 2021-02-04 DIAGNOSIS — Z1589 Genetic susceptibility to other disease: Secondary | ICD-10-CM | POA: Diagnosis not present

## 2021-02-04 DIAGNOSIS — Z87891 Personal history of nicotine dependence: Secondary | ICD-10-CM | POA: Diagnosis not present

## 2021-02-04 DIAGNOSIS — Z803 Family history of malignant neoplasm of breast: Secondary | ICD-10-CM | POA: Diagnosis not present

## 2021-02-04 DIAGNOSIS — Z800 Family history of malignant neoplasm of digestive organs: Secondary | ICD-10-CM | POA: Diagnosis not present

## 2021-02-04 DIAGNOSIS — Z780 Asymptomatic menopausal state: Secondary | ICD-10-CM | POA: Diagnosis not present

## 2021-02-04 DIAGNOSIS — D509 Iron deficiency anemia, unspecified: Secondary | ICD-10-CM | POA: Diagnosis not present

## 2021-02-04 DIAGNOSIS — M810 Age-related osteoporosis without current pathological fracture: Secondary | ICD-10-CM | POA: Diagnosis not present

## 2021-02-12 DIAGNOSIS — M47817 Spondylosis without myelopathy or radiculopathy, lumbosacral region: Secondary | ICD-10-CM | POA: Diagnosis not present

## 2021-02-12 DIAGNOSIS — E782 Mixed hyperlipidemia: Secondary | ICD-10-CM | POA: Diagnosis not present

## 2021-03-09 DIAGNOSIS — H6692 Otitis media, unspecified, left ear: Secondary | ICD-10-CM | POA: Diagnosis not present

## 2021-03-09 DIAGNOSIS — H9202 Otalgia, left ear: Secondary | ICD-10-CM | POA: Diagnosis not present

## 2021-03-10 DIAGNOSIS — F4323 Adjustment disorder with mixed anxiety and depressed mood: Secondary | ICD-10-CM | POA: Diagnosis not present

## 2021-03-17 DIAGNOSIS — F4323 Adjustment disorder with mixed anxiety and depressed mood: Secondary | ICD-10-CM | POA: Diagnosis not present

## 2021-03-30 DIAGNOSIS — H9313 Tinnitus, bilateral: Secondary | ICD-10-CM | POA: Diagnosis not present

## 2021-03-30 DIAGNOSIS — M5090 Cervical disc disorder, unspecified, unspecified cervical region: Secondary | ICD-10-CM | POA: Diagnosis not present

## 2021-03-30 DIAGNOSIS — M542 Cervicalgia: Secondary | ICD-10-CM | POA: Diagnosis not present

## 2021-03-31 DIAGNOSIS — L821 Other seborrheic keratosis: Secondary | ICD-10-CM | POA: Diagnosis not present

## 2021-03-31 DIAGNOSIS — D229 Melanocytic nevi, unspecified: Secondary | ICD-10-CM | POA: Diagnosis not present

## 2021-03-31 DIAGNOSIS — L728 Other follicular cysts of the skin and subcutaneous tissue: Secondary | ICD-10-CM | POA: Diagnosis not present

## 2021-03-31 DIAGNOSIS — D1801 Hemangioma of skin and subcutaneous tissue: Secondary | ICD-10-CM | POA: Diagnosis not present

## 2021-04-02 DIAGNOSIS — F4323 Adjustment disorder with mixed anxiety and depressed mood: Secondary | ICD-10-CM | POA: Diagnosis not present

## 2021-04-23 DIAGNOSIS — M5031 Other cervical disc degeneration,  high cervical region: Secondary | ICD-10-CM | POA: Diagnosis not present

## 2021-04-23 DIAGNOSIS — M5187 Other intervertebral disc disorders, lumbosacral region: Secondary | ICD-10-CM | POA: Diagnosis not present

## 2021-04-28 DIAGNOSIS — F4323 Adjustment disorder with mixed anxiety and depressed mood: Secondary | ICD-10-CM | POA: Diagnosis not present

## 2021-05-05 DIAGNOSIS — F4323 Adjustment disorder with mixed anxiety and depressed mood: Secondary | ICD-10-CM | POA: Diagnosis not present

## 2021-05-06 DIAGNOSIS — F32A Depression, unspecified: Secondary | ICD-10-CM | POA: Diagnosis not present

## 2021-05-06 DIAGNOSIS — Z800 Family history of malignant neoplasm of digestive organs: Secondary | ICD-10-CM | POA: Diagnosis not present

## 2021-05-06 DIAGNOSIS — Z1501 Genetic susceptibility to malignant neoplasm of breast: Secondary | ICD-10-CM | POA: Diagnosis not present

## 2021-05-06 DIAGNOSIS — Z1509 Genetic susceptibility to other malignant neoplasm: Secondary | ICD-10-CM | POA: Diagnosis not present

## 2021-05-06 DIAGNOSIS — Z9189 Other specified personal risk factors, not elsewhere classified: Secondary | ICD-10-CM | POA: Diagnosis not present

## 2021-05-06 DIAGNOSIS — D509 Iron deficiency anemia, unspecified: Secondary | ICD-10-CM | POA: Diagnosis not present

## 2021-05-06 DIAGNOSIS — F419 Anxiety disorder, unspecified: Secondary | ICD-10-CM | POA: Diagnosis not present

## 2021-05-06 DIAGNOSIS — M810 Age-related osteoporosis without current pathological fracture: Secondary | ICD-10-CM | POA: Diagnosis not present

## 2021-05-06 DIAGNOSIS — Z1589 Genetic susceptibility to other disease: Secondary | ICD-10-CM | POA: Diagnosis not present

## 2021-05-06 DIAGNOSIS — Z803 Family history of malignant neoplasm of breast: Secondary | ICD-10-CM | POA: Diagnosis not present

## 2021-05-12 DIAGNOSIS — F4323 Adjustment disorder with mixed anxiety and depressed mood: Secondary | ICD-10-CM | POA: Diagnosis not present

## 2021-05-23 DIAGNOSIS — M5023 Other cervical disc displacement, cervicothoracic region: Secondary | ICD-10-CM | POA: Diagnosis not present

## 2021-05-23 DIAGNOSIS — M47812 Spondylosis without myelopathy or radiculopathy, cervical region: Secondary | ICD-10-CM | POA: Diagnosis not present

## 2021-05-23 DIAGNOSIS — M5031 Other cervical disc degeneration,  high cervical region: Secondary | ICD-10-CM | POA: Diagnosis not present

## 2021-05-23 DIAGNOSIS — M5187 Other intervertebral disc disorders, lumbosacral region: Secondary | ICD-10-CM | POA: Diagnosis not present

## 2021-06-02 DIAGNOSIS — F4323 Adjustment disorder with mixed anxiety and depressed mood: Secondary | ICD-10-CM | POA: Diagnosis not present

## 2021-06-09 DIAGNOSIS — F4323 Adjustment disorder with mixed anxiety and depressed mood: Secondary | ICD-10-CM | POA: Diagnosis not present

## 2021-06-23 DIAGNOSIS — F4323 Adjustment disorder with mixed anxiety and depressed mood: Secondary | ICD-10-CM | POA: Diagnosis not present

## 2021-07-21 DIAGNOSIS — F900 Attention-deficit hyperactivity disorder, predominantly inattentive type: Secondary | ICD-10-CM | POA: Diagnosis not present

## 2021-07-21 DIAGNOSIS — F3342 Major depressive disorder, recurrent, in full remission: Secondary | ICD-10-CM | POA: Diagnosis not present

## 2021-07-22 DIAGNOSIS — Z1231 Encounter for screening mammogram for malignant neoplasm of breast: Secondary | ICD-10-CM | POA: Diagnosis not present

## 2021-07-28 DIAGNOSIS — M25561 Pain in right knee: Secondary | ICD-10-CM | POA: Diagnosis not present

## 2021-08-04 DIAGNOSIS — F4323 Adjustment disorder with mixed anxiety and depressed mood: Secondary | ICD-10-CM | POA: Diagnosis not present

## 2021-08-06 DIAGNOSIS — M79671 Pain in right foot: Secondary | ICD-10-CM | POA: Diagnosis not present

## 2021-08-06 DIAGNOSIS — M5116 Intervertebral disc disorders with radiculopathy, lumbar region: Secondary | ICD-10-CM | POA: Diagnosis not present

## 2021-08-06 DIAGNOSIS — N952 Postmenopausal atrophic vaginitis: Secondary | ICD-10-CM | POA: Diagnosis not present

## 2021-08-06 DIAGNOSIS — K648 Other hemorrhoids: Secondary | ICD-10-CM | POA: Diagnosis not present

## 2021-08-25 DIAGNOSIS — F4323 Adjustment disorder with mixed anxiety and depressed mood: Secondary | ICD-10-CM | POA: Diagnosis not present

## 2021-08-26 DIAGNOSIS — M542 Cervicalgia: Secondary | ICD-10-CM | POA: Diagnosis not present

## 2021-08-26 DIAGNOSIS — M47892 Other spondylosis, cervical region: Secondary | ICD-10-CM | POA: Diagnosis not present

## 2021-08-26 DIAGNOSIS — G44209 Tension-type headache, unspecified, not intractable: Secondary | ICD-10-CM | POA: Diagnosis not present

## 2021-08-28 DIAGNOSIS — M5442 Lumbago with sciatica, left side: Secondary | ICD-10-CM | POA: Diagnosis not present

## 2021-08-28 DIAGNOSIS — R262 Difficulty in walking, not elsewhere classified: Secondary | ICD-10-CM | POA: Diagnosis not present

## 2021-09-02 DIAGNOSIS — R262 Difficulty in walking, not elsewhere classified: Secondary | ICD-10-CM | POA: Diagnosis not present

## 2021-09-02 DIAGNOSIS — M5442 Lumbago with sciatica, left side: Secondary | ICD-10-CM | POA: Diagnosis not present

## 2021-09-10 DIAGNOSIS — M47892 Other spondylosis, cervical region: Secondary | ICD-10-CM | POA: Diagnosis not present

## 2021-09-10 DIAGNOSIS — M5442 Lumbago with sciatica, left side: Secondary | ICD-10-CM | POA: Diagnosis not present

## 2021-09-10 DIAGNOSIS — G44209 Tension-type headache, unspecified, not intractable: Secondary | ICD-10-CM | POA: Diagnosis not present

## 2021-09-10 DIAGNOSIS — M542 Cervicalgia: Secondary | ICD-10-CM | POA: Diagnosis not present

## 2021-09-16 DIAGNOSIS — M542 Cervicalgia: Secondary | ICD-10-CM | POA: Diagnosis not present

## 2021-09-16 DIAGNOSIS — M5442 Lumbago with sciatica, left side: Secondary | ICD-10-CM | POA: Diagnosis not present

## 2021-09-16 DIAGNOSIS — M47892 Other spondylosis, cervical region: Secondary | ICD-10-CM | POA: Diagnosis not present

## 2021-09-16 DIAGNOSIS — G44209 Tension-type headache, unspecified, not intractable: Secondary | ICD-10-CM | POA: Diagnosis not present

## 2021-09-18 DIAGNOSIS — M542 Cervicalgia: Secondary | ICD-10-CM | POA: Diagnosis not present

## 2021-09-18 DIAGNOSIS — G44209 Tension-type headache, unspecified, not intractable: Secondary | ICD-10-CM | POA: Diagnosis not present

## 2021-09-18 DIAGNOSIS — M47892 Other spondylosis, cervical region: Secondary | ICD-10-CM | POA: Diagnosis not present

## 2021-09-21 DIAGNOSIS — M542 Cervicalgia: Secondary | ICD-10-CM | POA: Diagnosis not present

## 2021-09-21 DIAGNOSIS — G44209 Tension-type headache, unspecified, not intractable: Secondary | ICD-10-CM | POA: Diagnosis not present

## 2021-09-21 DIAGNOSIS — M47892 Other spondylosis, cervical region: Secondary | ICD-10-CM | POA: Diagnosis not present

## 2021-09-21 DIAGNOSIS — M5442 Lumbago with sciatica, left side: Secondary | ICD-10-CM | POA: Diagnosis not present

## 2021-09-22 DIAGNOSIS — F4323 Adjustment disorder with mixed anxiety and depressed mood: Secondary | ICD-10-CM | POA: Diagnosis not present

## 2021-09-23 DIAGNOSIS — M5442 Lumbago with sciatica, left side: Secondary | ICD-10-CM | POA: Diagnosis not present

## 2021-09-23 DIAGNOSIS — M542 Cervicalgia: Secondary | ICD-10-CM | POA: Diagnosis not present

## 2021-09-23 DIAGNOSIS — G44209 Tension-type headache, unspecified, not intractable: Secondary | ICD-10-CM | POA: Diagnosis not present

## 2021-09-23 DIAGNOSIS — M47892 Other spondylosis, cervical region: Secondary | ICD-10-CM | POA: Diagnosis not present

## 2021-09-28 DIAGNOSIS — G44209 Tension-type headache, unspecified, not intractable: Secondary | ICD-10-CM | POA: Diagnosis not present

## 2021-09-28 DIAGNOSIS — M542 Cervicalgia: Secondary | ICD-10-CM | POA: Diagnosis not present

## 2021-09-28 DIAGNOSIS — M47892 Other spondylosis, cervical region: Secondary | ICD-10-CM | POA: Diagnosis not present

## 2021-09-29 DIAGNOSIS — R252 Cramp and spasm: Secondary | ICD-10-CM | POA: Diagnosis not present

## 2021-09-30 DIAGNOSIS — F900 Attention-deficit hyperactivity disorder, predominantly inattentive type: Secondary | ICD-10-CM | POA: Diagnosis not present

## 2021-09-30 DIAGNOSIS — F3342 Major depressive disorder, recurrent, in full remission: Secondary | ICD-10-CM | POA: Diagnosis not present

## 2021-10-07 DIAGNOSIS — J398 Other specified diseases of upper respiratory tract: Secondary | ICD-10-CM | POA: Diagnosis not present

## 2021-10-13 DIAGNOSIS — F4323 Adjustment disorder with mixed anxiety and depressed mood: Secondary | ICD-10-CM | POA: Diagnosis not present

## 2021-10-13 DIAGNOSIS — F411 Generalized anxiety disorder: Secondary | ICD-10-CM | POA: Diagnosis not present

## 2021-10-27 DIAGNOSIS — F4323 Adjustment disorder with mixed anxiety and depressed mood: Secondary | ICD-10-CM | POA: Diagnosis not present

## 2021-10-27 DIAGNOSIS — F411 Generalized anxiety disorder: Secondary | ICD-10-CM | POA: Diagnosis not present

## 2021-12-07 DIAGNOSIS — Z124 Encounter for screening for malignant neoplasm of cervix: Secondary | ICD-10-CM | POA: Diagnosis not present

## 2021-12-07 DIAGNOSIS — E782 Mixed hyperlipidemia: Secondary | ICD-10-CM | POA: Diagnosis not present

## 2021-12-07 DIAGNOSIS — Z79899 Other long term (current) drug therapy: Secondary | ICD-10-CM | POA: Diagnosis not present

## 2021-12-07 DIAGNOSIS — E559 Vitamin D deficiency, unspecified: Secondary | ICD-10-CM | POA: Diagnosis not present

## 2021-12-07 DIAGNOSIS — Z23 Encounter for immunization: Secondary | ICD-10-CM | POA: Diagnosis not present

## 2021-12-07 DIAGNOSIS — Z0000 Encounter for general adult medical examination without abnormal findings: Secondary | ICD-10-CM | POA: Diagnosis not present

## 2021-12-22 DIAGNOSIS — F411 Generalized anxiety disorder: Secondary | ICD-10-CM | POA: Diagnosis not present

## 2021-12-22 DIAGNOSIS — F4323 Adjustment disorder with mixed anxiety and depressed mood: Secondary | ICD-10-CM | POA: Diagnosis not present

## 2021-12-29 DIAGNOSIS — F4323 Adjustment disorder with mixed anxiety and depressed mood: Secondary | ICD-10-CM | POA: Diagnosis not present

## 2021-12-29 DIAGNOSIS — F411 Generalized anxiety disorder: Secondary | ICD-10-CM | POA: Diagnosis not present

## 2022-01-01 DIAGNOSIS — H35341 Macular cyst, hole, or pseudohole, right eye: Secondary | ICD-10-CM | POA: Diagnosis not present

## 2022-01-06 DIAGNOSIS — F411 Generalized anxiety disorder: Secondary | ICD-10-CM | POA: Diagnosis not present

## 2022-01-06 DIAGNOSIS — H2513 Age-related nuclear cataract, bilateral: Secondary | ICD-10-CM | POA: Diagnosis not present

## 2022-01-06 DIAGNOSIS — H35413 Lattice degeneration of retina, bilateral: Secondary | ICD-10-CM | POA: Diagnosis not present

## 2022-01-06 DIAGNOSIS — H35341 Macular cyst, hole, or pseudohole, right eye: Secondary | ICD-10-CM | POA: Diagnosis not present

## 2022-01-12 DIAGNOSIS — F4323 Adjustment disorder with mixed anxiety and depressed mood: Secondary | ICD-10-CM | POA: Diagnosis not present

## 2022-01-12 DIAGNOSIS — F411 Generalized anxiety disorder: Secondary | ICD-10-CM | POA: Diagnosis not present

## 2022-01-14 DIAGNOSIS — R0981 Nasal congestion: Secondary | ICD-10-CM | POA: Diagnosis not present

## 2022-01-14 DIAGNOSIS — H33321 Round hole, right eye: Secondary | ICD-10-CM | POA: Diagnosis not present

## 2022-01-14 DIAGNOSIS — H6692 Otitis media, unspecified, left ear: Secondary | ICD-10-CM | POA: Diagnosis not present

## 2022-01-14 DIAGNOSIS — R059 Cough, unspecified: Secondary | ICD-10-CM | POA: Diagnosis not present

## 2022-01-24 DIAGNOSIS — R928 Other abnormal and inconclusive findings on diagnostic imaging of breast: Secondary | ICD-10-CM | POA: Diagnosis not present

## 2022-01-24 DIAGNOSIS — Z1239 Encounter for other screening for malignant neoplasm of breast: Secondary | ICD-10-CM | POA: Diagnosis not present

## 2022-01-24 DIAGNOSIS — Z9189 Other specified personal risk factors, not elsewhere classified: Secondary | ICD-10-CM | POA: Diagnosis not present

## 2022-01-24 DIAGNOSIS — Z803 Family history of malignant neoplasm of breast: Secondary | ICD-10-CM | POA: Diagnosis not present

## 2022-01-28 DIAGNOSIS — G8929 Other chronic pain: Secondary | ICD-10-CM | POA: Diagnosis not present

## 2022-01-28 DIAGNOSIS — E785 Hyperlipidemia, unspecified: Secondary | ICD-10-CM | POA: Diagnosis not present

## 2022-01-28 DIAGNOSIS — F32A Depression, unspecified: Secondary | ICD-10-CM | POA: Diagnosis not present

## 2022-01-28 DIAGNOSIS — Z87891 Personal history of nicotine dependence: Secondary | ICD-10-CM | POA: Diagnosis not present

## 2022-01-28 DIAGNOSIS — F988 Other specified behavioral and emotional disorders with onset usually occurring in childhood and adolescence: Secondary | ICD-10-CM | POA: Diagnosis not present

## 2022-01-28 DIAGNOSIS — H35341 Macular cyst, hole, or pseudohole, right eye: Secondary | ICD-10-CM | POA: Diagnosis not present

## 2022-01-28 DIAGNOSIS — F419 Anxiety disorder, unspecified: Secondary | ICD-10-CM | POA: Diagnosis not present

## 2022-01-28 DIAGNOSIS — G2581 Restless legs syndrome: Secondary | ICD-10-CM | POA: Diagnosis not present

## 2022-01-28 DIAGNOSIS — M549 Dorsalgia, unspecified: Secondary | ICD-10-CM | POA: Diagnosis not present

## 2022-02-16 DIAGNOSIS — F411 Generalized anxiety disorder: Secondary | ICD-10-CM | POA: Diagnosis not present

## 2022-02-16 DIAGNOSIS — F4323 Adjustment disorder with mixed anxiety and depressed mood: Secondary | ICD-10-CM | POA: Diagnosis not present

## 2023-04-08 ENCOUNTER — Encounter: Payer: Self-pay | Admitting: Genetic Counselor

## 2023-04-08 DIAGNOSIS — Z1379 Encounter for other screening for genetic and chromosomal anomalies: Secondary | ICD-10-CM | POA: Insufficient documentation

## 2023-04-20 ENCOUNTER — Encounter: Payer: Self-pay | Admitting: Genetic Counselor
